# Patient Record
Sex: Female | Born: 1960 | Race: White | Hispanic: No | Marital: Married | Smoking: Never smoker
Health system: Southern US, Community
[De-identification: ages and names within clinical notes are randomized; demographics above are authoritative.]

## PROBLEM LIST (undated history)

## (undated) DIAGNOSIS — F419 Anxiety disorder, unspecified: Secondary | ICD-10-CM

## (undated) DIAGNOSIS — H04129 Dry eye syndrome of unspecified lacrimal gland: Secondary | ICD-10-CM

## (undated) DIAGNOSIS — N809 Endometriosis, unspecified: Secondary | ICD-10-CM

## (undated) DIAGNOSIS — I491 Atrial premature depolarization: Secondary | ICD-10-CM

## (undated) DIAGNOSIS — E785 Hyperlipidemia, unspecified: Secondary | ICD-10-CM

## (undated) DIAGNOSIS — K469 Unspecified abdominal hernia without obstruction or gangrene: Secondary | ICD-10-CM

## (undated) DIAGNOSIS — K219 Gastro-esophageal reflux disease without esophagitis: Secondary | ICD-10-CM

## (undated) HISTORY — PX: HEMORRHOID SURGERY: SHX153

## (undated) HISTORY — DX: Atrial premature depolarization: I49.1

## (undated) HISTORY — DX: Hyperlipidemia, unspecified: E78.5

## (undated) HISTORY — DX: Endometriosis, unspecified: N80.9

## (undated) HISTORY — DX: Unspecified abdominal hernia without obstruction or gangrene: K46.9

## (undated) HISTORY — DX: Dry eye syndrome of unspecified lacrimal gland: H04.129

## (undated) HISTORY — DX: Anxiety disorder, unspecified: F41.9

## (undated) HISTORY — PX: TUBAL LIGATION: SHX77

## (undated) HISTORY — PX: LAPAROSCOPY: SHX197

## (undated) HISTORY — DX: Gastro-esophageal reflux disease without esophagitis: K21.9

---

## 1999-03-15 ENCOUNTER — Inpatient Hospital Stay (HOSPITAL_COMMUNITY)
Admission: AD | Admit: 1999-03-15 | Discharge: 1999-03-15 | Payer: Self-pay | Admitting: Physical Medicine & Rehabilitation

## 1999-03-22 ENCOUNTER — Encounter (INDEPENDENT_AMBULATORY_CARE_PROVIDER_SITE_OTHER): Payer: Self-pay | Admitting: Specialist

## 1999-03-22 ENCOUNTER — Inpatient Hospital Stay (HOSPITAL_COMMUNITY): Admission: AD | Admit: 1999-03-22 | Discharge: 1999-03-24 | Payer: Self-pay | Admitting: Obstetrics and Gynecology

## 1999-04-25 ENCOUNTER — Other Ambulatory Visit: Admission: RE | Admit: 1999-04-25 | Discharge: 1999-04-25 | Payer: Self-pay | Admitting: Obstetrics and Gynecology

## 1999-12-05 ENCOUNTER — Encounter: Admission: RE | Admit: 1999-12-05 | Discharge: 1999-12-05 | Payer: Self-pay | Admitting: Obstetrics and Gynecology

## 1999-12-05 ENCOUNTER — Encounter: Payer: Self-pay | Admitting: Obstetrics and Gynecology

## 2000-05-31 ENCOUNTER — Other Ambulatory Visit: Admission: RE | Admit: 2000-05-31 | Discharge: 2000-05-31 | Payer: Self-pay | Admitting: Obstetrics and Gynecology

## 2001-09-17 ENCOUNTER — Other Ambulatory Visit: Admission: RE | Admit: 2001-09-17 | Discharge: 2001-09-17 | Payer: Self-pay | Admitting: Obstetrics and Gynecology

## 2002-09-22 ENCOUNTER — Other Ambulatory Visit: Admission: RE | Admit: 2002-09-22 | Discharge: 2002-09-22 | Payer: Self-pay | Admitting: Obstetrics and Gynecology

## 2002-11-18 ENCOUNTER — Encounter: Payer: Self-pay | Admitting: Obstetrics and Gynecology

## 2002-11-18 ENCOUNTER — Ambulatory Visit (HOSPITAL_COMMUNITY): Admission: RE | Admit: 2002-11-18 | Discharge: 2002-11-18 | Payer: Self-pay | Admitting: Obstetrics and Gynecology

## 2002-12-29 ENCOUNTER — Encounter: Admission: RE | Admit: 2002-12-29 | Discharge: 2002-12-29 | Payer: Self-pay | Admitting: Obstetrics and Gynecology

## 2002-12-29 ENCOUNTER — Encounter: Payer: Self-pay | Admitting: Obstetrics and Gynecology

## 2003-09-28 ENCOUNTER — Other Ambulatory Visit: Admission: RE | Admit: 2003-09-28 | Discharge: 2003-09-28 | Payer: Self-pay | Admitting: Obstetrics and Gynecology

## 2004-01-06 ENCOUNTER — Encounter: Admission: RE | Admit: 2004-01-06 | Discharge: 2004-01-06 | Payer: Self-pay | Admitting: Obstetrics and Gynecology

## 2004-09-26 ENCOUNTER — Other Ambulatory Visit: Admission: RE | Admit: 2004-09-26 | Discharge: 2004-09-26 | Payer: Self-pay | Admitting: Obstetrics and Gynecology

## 2005-01-17 ENCOUNTER — Encounter: Admission: RE | Admit: 2005-01-17 | Discharge: 2005-01-17 | Payer: Self-pay | Admitting: Obstetrics and Gynecology

## 2005-01-30 ENCOUNTER — Encounter: Admission: RE | Admit: 2005-01-30 | Discharge: 2005-01-30 | Payer: Self-pay | Admitting: Obstetrics and Gynecology

## 2005-05-02 ENCOUNTER — Ambulatory Visit: Payer: Self-pay | Admitting: Gastroenterology

## 2005-05-03 ENCOUNTER — Ambulatory Visit: Payer: Self-pay | Admitting: Gastroenterology

## 2005-05-09 ENCOUNTER — Ambulatory Visit: Payer: Self-pay | Admitting: Gastroenterology

## 2005-05-16 ENCOUNTER — Ambulatory Visit: Payer: Self-pay | Admitting: Gastroenterology

## 2005-09-28 ENCOUNTER — Other Ambulatory Visit: Admission: RE | Admit: 2005-09-28 | Discharge: 2005-09-28 | Payer: Self-pay | Admitting: Obstetrics and Gynecology

## 2005-10-13 ENCOUNTER — Encounter (INDEPENDENT_AMBULATORY_CARE_PROVIDER_SITE_OTHER): Payer: Self-pay | Admitting: Specialist

## 2005-10-13 ENCOUNTER — Encounter: Admission: RE | Admit: 2005-10-13 | Discharge: 2005-10-13 | Payer: Self-pay | Admitting: Obstetrics and Gynecology

## 2005-10-13 ENCOUNTER — Other Ambulatory Visit: Admission: RE | Admit: 2005-10-13 | Discharge: 2005-10-13 | Payer: Self-pay | Admitting: Diagnostic Radiology

## 2006-04-02 ENCOUNTER — Emergency Department (HOSPITAL_COMMUNITY): Admission: EM | Admit: 2006-04-02 | Discharge: 2006-04-02 | Payer: Self-pay | Admitting: Emergency Medicine

## 2006-10-08 ENCOUNTER — Encounter: Admission: RE | Admit: 2006-10-08 | Discharge: 2006-10-08 | Payer: Self-pay | Admitting: Family Medicine

## 2006-10-10 ENCOUNTER — Other Ambulatory Visit: Admission: RE | Admit: 2006-10-10 | Discharge: 2006-10-10 | Payer: Self-pay | Admitting: Obstetrics and Gynecology

## 2006-10-31 ENCOUNTER — Encounter: Admission: RE | Admit: 2006-10-31 | Discharge: 2006-10-31 | Payer: Self-pay | Admitting: Obstetrics and Gynecology

## 2007-06-11 ENCOUNTER — Ambulatory Visit: Payer: Self-pay | Admitting: Gastroenterology

## 2007-10-15 ENCOUNTER — Other Ambulatory Visit: Admission: RE | Admit: 2007-10-15 | Discharge: 2007-10-15 | Payer: Self-pay | Admitting: Obstetrics and Gynecology

## 2007-11-18 ENCOUNTER — Telehealth: Payer: Self-pay | Admitting: Gastroenterology

## 2007-11-18 ENCOUNTER — Encounter: Payer: Self-pay | Admitting: Gastroenterology

## 2008-10-15 ENCOUNTER — Other Ambulatory Visit: Admission: RE | Admit: 2008-10-15 | Discharge: 2008-10-15 | Payer: Self-pay | Admitting: Obstetrics and Gynecology

## 2008-10-30 ENCOUNTER — Encounter: Admission: RE | Admit: 2008-10-30 | Discharge: 2008-10-30 | Payer: Self-pay | Admitting: Obstetrics and Gynecology

## 2008-11-03 ENCOUNTER — Encounter: Admission: RE | Admit: 2008-11-03 | Discharge: 2008-11-03 | Payer: Self-pay | Admitting: Obstetrics and Gynecology

## 2009-01-12 ENCOUNTER — Telehealth: Payer: Self-pay | Admitting: Gastroenterology

## 2010-04-18 ENCOUNTER — Telehealth: Payer: Self-pay | Admitting: Gastroenterology

## 2010-05-31 ENCOUNTER — Encounter
Admission: RE | Admit: 2010-05-31 | Discharge: 2010-05-31 | Payer: Self-pay | Source: Home / Self Care | Attending: Obstetrics and Gynecology | Admitting: Obstetrics and Gynecology

## 2010-07-02 ENCOUNTER — Encounter: Payer: Self-pay | Admitting: Family Medicine

## 2010-07-03 ENCOUNTER — Encounter: Payer: Self-pay | Admitting: Obstetrics and Gynecology

## 2010-07-14 NOTE — Progress Notes (Signed)
Summary: Medication Refill  Phone Note Call from Patient Call back at (cell) [phone]   Caller: Patient Call For: Dr. Jarold Motto Reason for Call: Talk to Nurse Details for Reason: Medication Summary of Call: Spoke w/pt. and explained that she would need an OV before refilling her prescription. She does not want to come in, but is willing to do a "telephone consult."  Told her I would forward you the message. Thank you. Initial call taken by: Schuyler Amor,  April 18, 2010 2:34 PM  Follow-up for Phone Call        I advised the patient she has not been seen since 2008 and she was due for an office visit in 2010 and never scheduled one we have been telling her she needed an office visit since 10/30/2009, but she has not scheduled yet. I have given her a 30 day supply and advised her that she can get this medication from her Primary care if she sees them more often, but our office policy is not to rx patients with out seeing them and she since she does nto have barretts she MUST be seen every 2 years. She will not be given another refill if she does not call back to make an appt and keep it.  Follow-up by: Harlow Mares CMA Duncan Dull),  April 18, 2010 2:39 PM    New/Updated Medications: ACIPHEX 20 MG TBEC (RABEPRAZOLE SODIUM) one by mouth two times a day....MUST HAVE OFFICE VISIT. Prescriptions: ACIPHEX 20 MG TBEC (RABEPRAZOLE SODIUM) one by mouth two times a day....MUST HAVE OFFICE VISIT.  #60 x 0   Entered by:   Harlow Mares CMA (AAMA)   Authorized by:   Mardella Layman MD [facility]   Signed by:   Harlow Mares CMA (AAMA) on 04/18/2010   Method used:   Electronically to        [facility] [address] (retail)       [address], [address]       [place], [place]  [address]       Ph: [phone]       Fax: [phone]   RxID:   [id number]

## 2011-11-01 ENCOUNTER — Other Ambulatory Visit: Payer: Self-pay | Admitting: Obstetrics and Gynecology

## 2011-11-01 DIAGNOSIS — Z1231 Encounter for screening mammogram for malignant neoplasm of breast: Secondary | ICD-10-CM

## 2011-11-10 ENCOUNTER — Ambulatory Visit (INDEPENDENT_AMBULATORY_CARE_PROVIDER_SITE_OTHER): Payer: 59 | Admitting: General Surgery

## 2011-11-10 ENCOUNTER — Encounter (INDEPENDENT_AMBULATORY_CARE_PROVIDER_SITE_OTHER): Payer: Self-pay | Admitting: General Surgery

## 2011-11-10 VITALS — BP 94/62 | HR 74 | Temp 98.4°F | Resp 14 | Ht 67.5 in | Wt 200.2 lb

## 2011-11-10 DIAGNOSIS — K644 Residual hemorrhoidal skin tags: Secondary | ICD-10-CM

## 2011-11-10 NOTE — Progress Notes (Signed)
Patient ID: Kim Young, female   DOB: 1961-02-05, 51 y.o.   MRN: [id number]  Chief Complaint  Patient presents with  . New Evaluation    Est. Pt. Eval of External Hems    HPI Kim Young is a 51 y.o. female.  Referred by Dr. Shaune Pollack HPI This is a 51 year old female who has about a 1-1/2 year history of a mass near her anus. This is recently become more painful area she is now having difficulty with hygiene in this region as well. She has some occasional blood that she notices on the toilet paper. She is a history of 2 pregnancies. She has no family history of colorectal cancer. She has no prior colonoscopy. She is having a increasing constipation over the last couple weeks after her medication change but before that every other day bowel movements.Past Medical History  Diagnosis Date  . Palpitations   . Hyperlipidemia     Past Surgical History  Procedure Date  . Cesarean section 1998&2000    Family History  Problem Relation Age of Onset  . Heart disease Mother   . Stroke Mother   . Cancer Father     pancreatic  . Cancer Sister     Cervical  . Cancer Maternal Grandmother     Lung    Social History History  Substance Use Topics  . Smoking status: Never Smoker   . Smokeless tobacco: Never Used  . Alcohol Use: No    Allergies  Allergen Reactions  . Penicillins     REACTION: unspecified    Current Outpatient Prescriptions  Medication Sig Dispense Refill  . BYSTOLIC 5 MG tablet       . LOESTRIN 24 FE 1-20 MG-MCG tablet       . sertraline (ZOLOFT) 100 MG tablet       . simvastatin (ZOCOR) 40 MG tablet         Review of Systems Review of Systems  Constitutional: Negative for fever, chills and unexpected weight change.  HENT: Negative for hearing loss, congestion, sore throat, trouble swallowing and voice change.   Eyes: Negative for visual disturbance.  Respiratory: Negative for cough and wheezing.   Cardiovascular: Negative for chest pain,  palpitations and leg swelling.  Gastrointestinal: Positive for constipation and rectal pain. Negative for nausea, vomiting, abdominal pain, diarrhea, blood in stool, abdominal distention and anal bleeding.  Genitourinary: Negative for hematuria, vaginal bleeding and difficulty urinating.  Musculoskeletal: Negative for arthralgias.  Skin: Negative for rash and wound.  Neurological: Negative for seizures, syncope and headaches.  Hematological: Negative for adenopathy. Does not bruise/bleed easily.  Psychiatric/Behavioral: Negative for confusion.    Blood pressure 94/62, pulse 74, temperature 98.4 F (36.9 C), temperature source Temporal, resp. rate 14, height 5' 7.5" (1.715 m), weight 200 lb 3.2 oz (90.81 kg).  Physical Exam Physical Exam  Vitals reviewed. Constitutional: She appears well-developed and well-nourished.  Genitourinary: Rectal exam shows external hemorrhoid. Rectal exam shows no fissure. Guaiac negative stool.        Assessment    Symptomatic external hemorrhoid    Plan    The hemorrhoid is certainly not going to go away on its own. We discussed excising this external hemorrhoid in the office. We discussed risks of bleeding infection. We also a long discussion about numerous conservative measures to prevent hemorrhoids moving forward in the future.       Kim Young 11/10/2011, 9:55 AM

## 2011-11-10 NOTE — Patient Instructions (Signed)
1. Begin drinking 5-7 glasses of water per day 2. Start using fiber supplement daily 3. Minimize caffeine 4. Start using Miralax daily can get at drugstore     Hemorrhoids Hemorrhoids are enlarged (dilated) veins around the rectum. There are 2 types of hemorrhoids, and the type of hemorrhoid is determined by its location. Internal hemorrhoids occur in the veins just inside the rectum.They are usually not painful, but they may bleed.However, they may poke through to the outside and become irritated and painful. External hemorrhoids involve the veins outside the anus and can be felt as a painful swelling or hard lump near the anus.They are often itchy and may crack and bleed. Sometimes clots will form in the veins. This makes them swollen and painful. These are called thrombosed hemorrhoids. CAUSES Causes of hemorrhoids include:  Pregnancy. This increases the pressure in the hemorrhoidal veins.   Constipation.   Straining to have a bowel movement.   Obesity.   Heavy lifting or other activity that caused you to strain.  TREATMENT Most of the time hemorrhoids improve in 1 to 2 weeks. However, if symptoms do not seem to be getting better or if you have a lot of rectal bleeding, your caregiver may perform a procedure to help make the hemorrhoids get smaller or remove them completely.Possible treatments include:  Rubber band ligation. A rubber band is placed at the base of the hemorrhoid to cut off the circulation.   Sclerotherapy. A chemical is injected to shrink the hemorrhoid.   Infrared light therapy. Tools are used to burn the hemorrhoid.   Hemorrhoidectomy. This is surgical removal of the hemorrhoid.  HOME CARE INSTRUCTIONS   Increase fiber in your diet. Ask your caregiver about using fiber supplements.   Drink enough water and fluids to keep your urine clear or pale yellow.   Exercise regularly.   Go to the bathroom when you have the urge to have a bowel movement. Do not  wait.   Avoid straining to have bowel movements.   Keep the anal area dry and clean.   Only take over-the-counter or prescription medicines for pain, discomfort, or fever as directed by your caregiver.  If your hemorrhoids are thrombosed:  Take warm sitz baths for 20 to 30 minutes, 3 to 4 times per day.   If the hemorrhoids are very tender and swollen, place ice packs on the area as tolerated. Using ice packs between sitz baths may be helpful. Fill a plastic bag with ice. Place a towel between the bag of ice and your skin.   Medicated creams and suppositories may be used or applied as directed.   Do not use a donut-shaped pillow or sit on the toilet for long periods. This increases blood pooling and pain.  SEEK MEDICAL CARE IF:   You have increasing pain and swelling that is not controlled with your medicine.   You have uncontrolled bleeding.   You have difficulty or you are unable to have a bowel movement.   You have pain or inflammation outside the area of the hemorrhoids.   You have chills or an oral temperature above 102 F (38.9 C).  MAKE SURE YOU:   Understand these instructions.   Will watch your condition.   Will get help right away if you are not doing well or get worse.  Document Released: 05/26/2000 Document Revised: 05/18/2011 Document Reviewed: 10/01/2007 [facility] Patient Information 2012 [place], [place].

## 2011-11-21 ENCOUNTER — Ambulatory Visit
Admission: RE | Admit: 2011-11-21 | Discharge: 2011-11-21 | Disposition: A | Discharge status: Home / Self Care | Payer: 59 | Source: Ambulatory Visit | Attending: Obstetrics and Gynecology | Admitting: Obstetrics and Gynecology

## 2011-11-21 DIAGNOSIS — Z1231 Encounter for screening mammogram for malignant neoplasm of breast: Secondary | ICD-10-CM

## 2011-11-23 ENCOUNTER — Encounter (INDEPENDENT_AMBULATORY_CARE_PROVIDER_SITE_OTHER): Payer: Self-pay | Admitting: General Surgery

## 2011-11-23 ENCOUNTER — Ambulatory Visit (INDEPENDENT_AMBULATORY_CARE_PROVIDER_SITE_OTHER): Payer: 59 | Admitting: General Surgery

## 2011-11-23 VITALS — BP 120/60 | HR 60 | Resp 16 | Ht 67.0 in | Wt 199.0 lb

## 2011-11-23 DIAGNOSIS — K644 Residual hemorrhoidal skin tags: Secondary | ICD-10-CM

## 2011-11-23 MED ORDER — OXYCODONE-ACETAMINOPHEN 10-325 MG PO TABS: 1.0000 | ORAL_TABLET | Freq: Four times a day (QID) | ORAL | Status: AC | PRN

## 2011-11-23 NOTE — Patient Instructions (Signed)
[facility] _______[facility] ° °RECTAL SURGERY POST OP INSTRUCTIONS: POST OP INSTRUCTIONS ° °Always review your discharge instruction sheet given to you by the facility where your surgery was performed. °IF YOU HAVE DISABILITY OR FAMILY LEAVE FORMS, YOU MUST BRING THEM TO THE OFFICE FOR PROCESSING.   °DO NOT GIVE THEM TO YOUR DOCTOR. ° °1. A  prescription for pain medication may be given to you upon discharge.  Take your pain medication as prescribed, if needed.  If narcotic pain medicine is not needed, then you may take acetaminophen (Tylenol) or ibuprofen (Advil) as needed. °2. Take your usually prescribed medications unless otherwise directed. °3. If you need a refill on your pain medication, please contact your pharmacy.  They will contact our office to request authorization. Prescriptions will not be filled after 5 pm or on week-ends. °4. You should follow a light diet the first 48 hours after arrival home, such as soup and crackers, etc.  Be sure to include lots of fluids daily.  Resume your normal diet 2-3 days after surgery.. °5. Most patients will experience some swelling and discomfort in the rectal area. Ice packs, reclining and warm tub soaks will help.  Swelling and discomfort can take several days to resolve.  °6. It is common to experience some constipation if taking pain medication after surgery.  Increasing fluid intake and taking a stool softener (such as Colace) will usually help or prevent this problem from occurring.  A mild laxative (Milk of Magnesia or Miralax) should be taken according to package directions if there are no bowel movements after 48 hours. °7. Unless discharge instructions indicate otherwise, leave your bandage dry and in place for 24 hours, or remove the bandage if you have a bowel movement. You may notice a small amount of bleeding with bowel movements for the first few days. You may have some packing in the rectum which will come out over the first day or two. You  will need to wear an absorbent pad or soft cotton gauze in your underwear until the drainage stops.it. °8. ACTIVITIES:  You may resume regular (light) daily activities beginning the next day--such as daily self-care, walking, climbing stairs--gradually increasing activities as tolerated.  You may have sexual intercourse when it is comfortable.  Refrain from any heavy lifting or straining until approved by your doctor. °a. You may drive when you are no longer taking prescription pain medication, you can comfortably wear a seatbelt, and you can safely maneuver your car and apply brakes. °b. RETURN TO WORK: : ____________________ °c.  °9. You should see your doctor in the office for a follow-up appointment approximately 2-3 weeks after your surgery.  Make sure that you call for this appointment within a day or two after you arrive home to insure a convenient appointment time. °10. OTHER INSTRUCTIONS:  __________________________________________________________________________________________________________________________________________________________________________________________  °WHEN TO CALL YOUR DOCTOR: °1. Fever over 101.0 °2. Inability to urinate °3. Nausea and/or vomiting °4. Extreme swelling or bruising °5. Continued bleeding from rectum. °6. Increased pain, redness, or drainage from the incision °7. Constipation ° °The clinic staff is available to answer your questions during regular business hours.  Please don’t hesitate to call and ask to speak to one of the nurses for clinical concerns.  If you have a medical emergency, go to the nearest emergency room or call 911.  A surgeon from [facility] is always on call at the hospital ° ° °[address], [address], [place], [place]  [address] ? °   [address] [address], [place], [place]   [address] °[phone] ? [phone] ? FAX [phone] °Web site: [email or web address] ° °

## 2011-11-23 NOTE — Progress Notes (Signed)
Subjective:     Patient ID: Kim Young, female   DOB: [birth year], 51 y.o.   MRN: [id number]  HPI  52 yof with symptomatic rectal skin tag who presents for removal.  There have been no changes since our last visit.  Review of Systems     Objective:   Physical Exam Anterior anal skin tag, anoscopy with very small internal hemorrhoids    Assessment:     Anal skin tag    Plan:     We discussed excision.  I then performed anoscopy first.  I then cleansed the area with betadine.  I then performed an anal block with 1% lidocaine/.25% marcaine.  I then excised the skin tag sharply.  I closed the defect with 3-0 chromic suture.  Dressing was applied.

## 2011-11-24 ENCOUNTER — Encounter (INDEPENDENT_AMBULATORY_CARE_PROVIDER_SITE_OTHER): Payer: 59 | Admitting: General Surgery

## 2011-11-29 ENCOUNTER — Telehealth (INDEPENDENT_AMBULATORY_CARE_PROVIDER_SITE_OTHER): Payer: Self-pay

## 2011-11-29 NOTE — Telephone Encounter (Signed)
Called pt to notify her that her path was a benign polyp. I made a f/u visit for pt on 7/3 with DrWakefield.

## 2011-12-13 ENCOUNTER — Encounter (INDEPENDENT_AMBULATORY_CARE_PROVIDER_SITE_OTHER): Payer: Self-pay | Admitting: General Surgery

## 2011-12-13 ENCOUNTER — Ambulatory Visit (INDEPENDENT_AMBULATORY_CARE_PROVIDER_SITE_OTHER): Payer: 59 | Admitting: General Surgery

## 2011-12-13 VITALS — BP 112/66 | HR 64 | Temp 98.0°F | Resp 12 | Ht 67.0 in | Wt 201.6 lb

## 2011-12-13 DIAGNOSIS — Z09 Encounter for follow-up examination after completed treatment for conditions other than malignant neoplasm: Secondary | ICD-10-CM

## 2011-12-13 NOTE — Progress Notes (Signed)
Subjective:     Patient ID: Kim Young, female   DOB: [birth year], 51 y.o.   MRN: [id number]  HPI 6 yof s/p excision of anal skin tag.  Doing well now without complaints.   Review of Systems     Objective:   Physical Exam Mild edema in perianal region but otherwise healing well no infection    Assessment:     S/p skin tag excision    Plan:     Will continue fiber and water, I will see back as needed

## 2012-10-23 ENCOUNTER — Encounter: Payer: Self-pay | Admitting: Obstetrics and Gynecology

## 2012-12-31 ENCOUNTER — Other Ambulatory Visit: Payer: Self-pay

## 2012-12-31 DIAGNOSIS — Z1231 Encounter for screening mammogram for malignant neoplasm of breast: Secondary | ICD-10-CM

## 2013-01-13 ENCOUNTER — Ambulatory Visit
Admission: RE | Admit: 2013-01-13 | Discharge: 2013-01-13 | Disposition: A | Discharge status: Home / Self Care | Payer: 59 | Source: Ambulatory Visit

## 2013-01-13 DIAGNOSIS — Z1231 Encounter for screening mammogram for malignant neoplasm of breast: Secondary | ICD-10-CM

## 2013-02-12 ENCOUNTER — Ambulatory Visit: Payer: Self-pay | Admitting: Obstetrics and Gynecology

## 2013-02-14 ENCOUNTER — Ambulatory Visit: Payer: Self-pay | Admitting: Obstetrics and Gynecology

## 2013-02-18 ENCOUNTER — Encounter: Payer: Self-pay | Admitting: Obstetrics and Gynecology

## 2013-02-19 ENCOUNTER — Encounter: Payer: Self-pay | Admitting: Obstetrics and Gynecology

## 2013-02-19 ENCOUNTER — Ambulatory Visit (INDEPENDENT_AMBULATORY_CARE_PROVIDER_SITE_OTHER): Payer: 59 | Admitting: Obstetrics and Gynecology

## 2013-02-19 VITALS — BP 100/66 | HR 56 | Ht 67.0 in | Wt 202.5 lb

## 2013-02-19 DIAGNOSIS — Z01419 Encounter for gynecological examination (general) (routine) without abnormal findings: Secondary | ICD-10-CM

## 2013-02-19 DIAGNOSIS — Z0000 Encounter for general adult medical examination without abnormal findings: Secondary | ICD-10-CM

## 2013-02-19 DIAGNOSIS — N390 Urinary tract infection, site not specified: Secondary | ICD-10-CM

## 2013-02-19 LAB — POCT URINALYSIS DIPSTICK
Nitrite, UA: NEGATIVE
pH, UA: 5

## 2013-02-19 MED ORDER — NORETHIN ACE-ETH ESTRAD-FE 1-20 MG-MCG(24) PO TABS: 1.0000 | ORAL_TABLET | Freq: Every day | ORAL | Status: DC

## 2013-02-19 NOTE — Progress Notes (Signed)
Patient ID: Kim Young, female   DOB: Feb 13, 1961, 52 y.o.   MRN: [id number] GYNECOLOGY VISIT  PCP: Dr. Shaune Pollack  Referring provider:   HPI: 52 y.o.   Married  Caucasian  female   G2P2002 with Patient's last menstrual period was 01/13/2013.   here for   AEX. No problems. Happy with LoEstrin 24.  Wants to continue.   Feels hot all the time.    Hgb:  PCP Urine:  1+ WBC's, asymptomatic.  Last UTI was a long time ago.   GYNECOLOGIC HISTORY: Patient's last menstrual period was 01/13/2013. Sexually active:  yes Partner preference: female Contraception:  Loestrin 24 OCP's Menopausal hormone therapy: no DES exposure:  no  Blood transfusions:   no Sexually transmitted diseases:  no  GYN Procedures:  2 C-sections and tubal ligation Mammogram:  01/2013 wnl: [facility].  Has a history of left breast cyst aspiration - within the last 10 years - benign.              Pap:  02-03-11 wnl  History of abnormal pap smear:  no   OB History   Grav Para Term Preterm Abortions TAB SAB Ect Mult Living   2 2 2       2        LIFESTYLE: Exercise:     no         Tobacco:     no Alcohol:        no Drug use:     no  OTHER HEALTH MAINTENANCE: Tetanus/TDap:  PCP Gardisil:  NA Influenza:  02/2012 Zostavax:  NA  Bone density: n/a Colonoscopy: n/a  Cholesterol check:  2013 wnl with medication:PCP  Family History  Problem Relation Age of Onset  . Heart disease Mother   . Stroke Mother   . Diabetes Mother   . CVA Mother 67    on dialysis  . Hypertension Mother   . Hyperlipidemia Mother   . Cancer Father     pancreatic  . Cancer Sister     Cervical  . Cancer Maternal Grandmother     Lung    Patient Active Problem List   Diagnosis Date Noted  . External hemorrhoids with complication 11/10/2011   Past Medical History  Diagnosis Date  . Palpitations   . Hyperlipidemia   . PAC (premature atrial contraction)   . Hyperlipidemia   . GERD (gastroesophageal reflux disease)    . Hernia     D. Jarold Motto  . Dry eye   . Endometriosis     Past Surgical History  Procedure Laterality Date  . Hemorrhoid surgery    . Tubal ligation    . Cesarean section  1998&2000    x2  . Laparoscopy      endometriosis    ALLERGIES: Penicillins  Current Outpatient Prescriptions  Medication Sig Dispense Refill  . BYSTOLIC 5 MG tablet       . LOESTRIN 24 FE 1-20 MG-MCG tablet       . sertraline (ZOLOFT) 100 MG tablet Take 100 mg by mouth daily.      . simvastatin (ZOCOR) 40 MG tablet        No current facility-administered medications for this visit.     ROS:  Pertinent items are noted in HPI.  SOCIAL HISTORY:  2 children.    PHYSICAL EXAMINATION:    BP 100/66  Pulse 56  Ht 5\' 7"  (1.702 m)  Wt 202 lb 8 oz (91.853 kg)  BMI 31.71  kg/m2  LMP 01/13/2013   Wt Readings from Last 3 Encounters:  02/19/13 202 lb 8 oz (91.853 kg)  12/13/11 201 lb 9.6 oz (91.445 kg)  11/23/11 199 lb (90.266 kg)     Ht Readings from Last 3 Encounters:  02/19/13 5\' 7"  (1.702 m)  12/13/11 5\' 7"  (1.702 m)  11/23/11 5\' 7"  (1.702 m)    General appearance: alert, cooperative and appears stated age Head: Normocephalic, without obvious abnormality, atraumatic Neck: no adenopathy, supple, symmetrical, trachea midline and thyroid not enlarged, symmetric, no tenderness/mass/nodules Lungs: clear to auscultation bilaterally Breasts: Inspection negative, No nipple retraction or dimpling, No nipple discharge or bleeding, No axillary or supraclavicular adenopathy, Normal to palpation without dominant masses Heart: regular rate and rhythm Abdomen: soft, non-tender; no masses,  no organomegaly Extremities: extremities normal, atraumatic, no cyanosis or edema Skin: Skin color, texture, turgor normal. No rashes or lesions Lymph nodes: Cervical, supraclavicular, and axillary nodes normal. No abnormal inguinal nodes palpated Neurologic: Grossly normal  Pelvic: External genitalia:  no lesions               Urethra:  normal appearing urethra with no masses, tenderness or lesions              Bartholins and Skenes: normal                 Vagina: normal appearing vagina with normal color and discharge, no lesions              Cervix: normal appearance              Pap and high risk HPV testing done: yes.            Bimanual Exam:  Uterus:  uterus is normal size, shape, consistency and nontender                                      Adnexa: normal adnexa in size, nontender and no masses                                      Rectovaginal: Confirms                                      Anus:  normal sphincter tone, no lesions  ASSESSMENT  Normal gynecologic exam. Pyuria.  PLAN  Urine culture.  Mammogram yearly. Pap smear and high risk HPV testing Counseled on breast self exam, adequate intake of calcium and vitamin D Loestrin 24 x 12 months per Epic orders Return annually or prn   An After Visit Summary was printed and given to the patient.

## 2013-02-19 NOTE — Patient Instructions (Signed)

## 2013-02-21 LAB — IPS PAP TEST WITH HPV

## 2013-02-26 ENCOUNTER — Other Ambulatory Visit: Payer: Self-pay | Admitting: Obstetrics and Gynecology

## 2013-02-27 NOTE — Telephone Encounter (Signed)
eScribe request for refill on MICROGESTIN FE 1/20 Last AEX - 02/19/13 Next AEX - 02/20/13 Pt was changed to LOESTRIN FE 24 at AEX on 02/19/13 and RX x 1 year sent.  RX denied

## 2013-03-14 ENCOUNTER — Ambulatory Visit: Payer: Self-pay | Admitting: Obstetrics and Gynecology

## 2013-04-22 ENCOUNTER — Telehealth: Payer: Self-pay | Admitting: Cardiology

## 2013-04-22 NOTE — Telephone Encounter (Signed)
New Problem:  Pt is requesting she have blood work done before her next appt. Pt does not have lab orders in Epic. Please let pt know if she needs blood work or not.

## 2013-04-23 ENCOUNTER — Other Ambulatory Visit: Payer: Self-pay | Admitting: General Surgery

## 2013-04-23 DIAGNOSIS — I2510 Atherosclerotic heart disease of native coronary artery without angina pectoris: Secondary | ICD-10-CM

## 2013-04-23 DIAGNOSIS — Z79899 Other long term (current) drug therapy: Secondary | ICD-10-CM

## 2013-04-23 NOTE — Telephone Encounter (Signed)
Pt aware lab work put in on Weds 04/30/13 for NMR and ALT Panel. Labs ordered and put on lab schedule.

## 2013-04-23 NOTE — Telephone Encounter (Signed)
Ok to order fasting NMR lipid panel and ALT

## 2013-04-30 ENCOUNTER — Other Ambulatory Visit (INDEPENDENT_AMBULATORY_CARE_PROVIDER_SITE_OTHER): Payer: 59

## 2013-04-30 DIAGNOSIS — I2510 Atherosclerotic heart disease of native coronary artery without angina pectoris: Secondary | ICD-10-CM

## 2013-04-30 DIAGNOSIS — Z79899 Other long term (current) drug therapy: Secondary | ICD-10-CM

## 2013-05-01 LAB — NMR LIPOPROFILE WITH LIPIDS
HDL Size: 8.6 nm — ABNORMAL LOW (ref >=9.2)
LDL Size: 20.3 nm — ABNORMAL LOW (ref >20.5)
VLDL Size: 51.3 nm — ABNORMAL HIGH (ref <=46.6)

## 2013-05-02 ENCOUNTER — Ambulatory Visit (INDEPENDENT_AMBULATORY_CARE_PROVIDER_SITE_OTHER): Payer: 59 | Admitting: Cardiology

## 2013-05-02 ENCOUNTER — Encounter: Payer: Self-pay | Admitting: Cardiology

## 2013-05-02 VITALS — BP 116/70 | Ht 67.0 in | Wt 203.0 lb

## 2013-05-02 DIAGNOSIS — E785 Hyperlipidemia, unspecified: Secondary | ICD-10-CM

## 2013-05-02 DIAGNOSIS — I491 Atrial premature depolarization: Secondary | ICD-10-CM

## 2013-05-02 NOTE — Progress Notes (Signed)
  [address] [place], [place]  [address] Phone: [phone] Fax:  [phone]  Date:  05/02/2013   ID:  Kim Young, DOB 04/28/1961, MRN [id number]  PCP:  Hollice Espy, MD  Cardiologist:  Armanda Magic, MD     History of Present Illness: Kim Young is a 52 y.o. female with a history of PAC's and dyslipidemia.  She is doing well.  She denies any chest pain, SOB, DOE, LE edema, dizziness, palpitations or syncope.     Wt Readings from Last 3 Encounters:  05/02/13 203 lb (92.08 kg)  02/19/13 202 lb 8 oz (91.853 kg)  12/13/11 201 lb 9.6 oz (91.445 kg)     Past Medical History  Diagnosis Date  . Palpitations   . GERD (gastroesophageal reflux disease)   . Hernia     D. Jarold Motto  . Dry eye   . Endometriosis   . PAC (premature atrial contraction)   . Hyperlipidemia   . Hyperlipidemia   . Anxiety     Current Outpatient Prescriptions  Medication Sig Dispense Refill  . BYSTOLIC 5 MG tablet       . Norethindrone Acetate-Ethinyl Estrad-FE (LOESTRIN 24 FE) 1-20 MG-MCG(24) tablet Take 1 tablet by mouth daily.  3 Package  3  . sertraline (ZOLOFT) 100 MG tablet Take 100 mg by mouth daily.      . simvastatin (ZOCOR) 40 MG tablet        No current facility-administered medications for this visit.    Allergies:    Allergies  Allergen Reactions  . Cymbalta [Duloxetine Hcl]     agitation  . Penicillins     REACTION: unspecified    Social History:  The patient  reports that she has never smoked. She has never used smokeless tobacco. She reports that she does not drink alcohol or use illicit drugs.   Family History:  The patient's family history includes CVA (age of onset: 48) in her mother; Cancer in her father, maternal grandmother, and sister; Diabetes in her mother; Heart disease in her mother; Hyperlipidemia in her mother; Hypertension in her mother; Stroke in her mother.   ROS:  Please see the history of present illness.      All other systems  reviewed and negative.   PHYSICAL EXAM: VS:  BP 116/70  Ht 5\' 7"  (1.702 m)  Wt 203 lb (92.08 kg)  BMI 31.79 kg/m2 Well nourished, well developed, in no acute distress HEENT: normal Neck: no JVD Cardiac:  normal S1, S2; RRR; no murmur Lungs:  clear to auscultation bilaterally, no wheezing, rhonchi or rales Abd: soft, nontender, no hepatomegaly Ext: no edema Skin: warm and dry Neuro:  CNs 2-12 intact, no focal abnormalities noted  EKG:  NSR with no ST changes     ASSESSMENT AND PLAN:  1. PAC's - asymptomatic  - continue Bystolic 2. Dyslipidemia - her last NMR panel was high risk with poorly controlled LDL.  I have referred her back to lipid clinic  - continue simvastatin  Followup with me in 1 year  Signed, Armanda Magic, MD 05/02/2013 4:08 PM

## 2013-05-02 NOTE — Patient Instructions (Signed)
Your physician recommends that you continue on your current medications as directed. Please refer to the Current Medication list given to you today.  Your physician wants you to follow-up in: 1 year with Dr. Turner. You will receive a reminder letter in the mail two months in advance. If you don't receive a letter, please call our office to schedule the follow-up appointment.  

## 2013-05-15 ENCOUNTER — Telehealth: Payer: Self-pay | Admitting: General Surgery

## 2013-05-15 ENCOUNTER — Encounter: Payer: Self-pay | Admitting: General Surgery

## 2013-05-15 DIAGNOSIS — I2510 Atherosclerotic heart disease of native coronary artery without angina pectoris: Secondary | ICD-10-CM

## 2013-05-15 DIAGNOSIS — Z79899 Other long term (current) drug therapy: Secondary | ICD-10-CM

## 2013-05-15 MED ORDER — NEBIVOLOL HCL 5 MG PO TABS: 5.0000 mg | ORAL_TABLET | Freq: Every day | ORAL | Status: DC

## 2013-05-15 MED ORDER — PSYLLIUM 28.3 % PO POWD: 1.0000 | Freq: Two times a day (BID) | ORAL | Status: DC

## 2013-05-15 NOTE — Telephone Encounter (Signed)
Message copied by Nita Sells on Thu May 15, 2013  9:53 AM ------      Message from: Lou Miner      Created: Tue May 13, 2013  4:03 PM       Given cholesterol improving on simvastatin 40 mg qd, and LDL-P trending towards goal of < 1300, have patient continue simvastatin 40 mg qd, and add metamucil twice daily.  This will lower cholesterol in a similar way as Zetia, but without side effects.              Plan:      1.  Continue simvastatin 40 mg qd.      2.  Take Metamucil powder - 1 tablespoon with 8 ounces of water twice daily in between meals.      3.  Recheck NMR LipoProfile and Hepatic panel in 4 months, then have patient follow up with me 3-4 days later.  You can forward the date and time she is to see me and I can create appointment (under Audrie Lia).      Please notify patient, update meds, and set up lab.  Send appointment date/time back to me if you need me to set it up. Thanks. ------

## 2013-05-15 NOTE — Telephone Encounter (Signed)
Pt is aware. Metamucil added to pts med list. Pt is set up for lab work 4.3.15. She will be coming in to see Riki Rusk on 09/17/13 for lipid clinic. Sent to [facility] to schedule pt.

## 2013-05-15 NOTE — Telephone Encounter (Signed)
Spoke to pt. Gave results for Lab work and refilled rx for pt.

## 2013-05-15 NOTE — Telephone Encounter (Signed)
Follow up         Pt said she has been waiting 2 wks for a refill on her prescriptions plus BYFTOLIC.   Pt was instructed that Dr will consult with Riki Rusk and get back to her it has been a wk now.    Pt needs a call back about this please.

## 2013-06-02 ENCOUNTER — Telehealth: Payer: Self-pay | Admitting: Cardiology

## 2013-06-02 MED ORDER — SIMVASTATIN 40 MG PO TABS: 40.0000 mg | ORAL_TABLET | Freq: Every day | ORAL | Status: DC

## 2013-06-02 NOTE — Telephone Encounter (Signed)
Made pt aware med has been sent to pharmacy for her.

## 2013-06-02 NOTE — Telephone Encounter (Signed)
New proble,    Pt needs SIMBASTATON called in to [facility] [address]    ASAP please.

## 2013-08-09 ENCOUNTER — Other Ambulatory Visit: Payer: Self-pay | Admitting: Cardiology

## 2013-09-12 ENCOUNTER — Other Ambulatory Visit: Payer: 59

## 2013-09-17 ENCOUNTER — Ambulatory Visit: Payer: 59 | Admitting: Pharmacist

## 2013-09-19 ENCOUNTER — Ambulatory Visit: Payer: 59 | Admitting: Pharmacist

## 2013-12-12 ENCOUNTER — Other Ambulatory Visit: Payer: Self-pay | Admitting: Cardiology

## 2014-02-07 ENCOUNTER — Other Ambulatory Visit: Payer: Self-pay | Admitting: Obstetrics and Gynecology

## 2014-02-09 NOTE — Telephone Encounter (Signed)
Last refilled/AEX: 02/19/13 #3 packs with 3 refills Dr. Jose Persia Scheduled: 02/20/14 with Dr. Edward Jolly Last mammogram: 01/13/13 Bi-Rads 1 No current mammogram scheduled.  Please Advise.

## 2014-02-20 ENCOUNTER — Ambulatory Visit (INDEPENDENT_AMBULATORY_CARE_PROVIDER_SITE_OTHER): Payer: 59 | Admitting: Obstetrics and Gynecology

## 2014-02-20 ENCOUNTER — Encounter: Payer: Self-pay | Admitting: Obstetrics and Gynecology

## 2014-02-20 VITALS — BP 110/80 | HR 60 | Resp 18 | Ht 67.25 in | Wt 202.0 lb

## 2014-02-20 DIAGNOSIS — Z1211 Encounter for screening for malignant neoplasm of colon: Secondary | ICD-10-CM

## 2014-02-20 DIAGNOSIS — Z01419 Encounter for gynecological examination (general) (routine) without abnormal findings: Secondary | ICD-10-CM

## 2014-02-20 DIAGNOSIS — Z0000 Encounter for general adult medical examination without abnormal findings: Secondary | ICD-10-CM

## 2014-02-20 DIAGNOSIS — N912 Amenorrhea, unspecified: Secondary | ICD-10-CM

## 2014-02-20 LAB — POCT URINALYSIS DIPSTICK
Bilirubin, UA: NEGATIVE
GLUCOSE UA: NEGATIVE
KETONES UA: NEGATIVE
Leukocytes, UA: NEGATIVE
Nitrite, UA: NEGATIVE
Protein, UA: NEGATIVE
RBC UA: NEGATIVE
Urobilinogen, UA: NEGATIVE
pH, UA: 5

## 2014-02-20 NOTE — Patient Instructions (Signed)

## 2014-02-20 NOTE — Progress Notes (Addendum)
Patient ID: Kim Young, female   DOB: 1961-03-11, 53 y.o.   MRN: [id number] GYNECOLOGY VISIT  PCP:  Shaune Pollack, MD  Referring provider:   HPI: 53 y.o.   Married  Caucasian  female   G2P2002 with No LMP recorded.   here for   AEX. On OCPs.  Not really having menses, just light spotting.  This is about the same as last year.  No cramping. No hot flashes.   Takes Bystolic for PACs. Sees cardiology before the end of the year.  Sees Dr. Mayford Knife.  Has elevated CRP.  Sister died of cervical cancer.   Hgb:    Labs with cardiologist Urine:  Neg  GYNECOLOGIC HISTORY: No LMP recorded. Sexually active:  yes Partner preference:  female Contraception:  OCP's--Loestrin-24  Menopausal hormone therapy: n/a DES exposure:  no  Blood transfusions: no   Sexually transmitted diseases:  no  GYN procedures and prior surgeries:  C-section x2 and tubal ligation. Last mammogram:  01-13-13 heterogeneously dense breasts, otherwise normal:[facility].  Has history of left breast cyst aspiration--within the last 10 years--benign.            Last pap and high risk HPV testing:  02-19-13 wnl:neg HR HPV  History of abnormal pap smear:  no   OB History   Grav Para Term Preterm Abortions TAB SAB Ect Mult Living   LIFESTYLE: Exercise:  no              OTHER HEALTH MAINTENANCE: Tetanus/TDap:  Up to date with PCP HPV:                  n/a Influenza:          03/2013   Bone density:    n/a Colonoscopy:   n/a  Cholesterol check:   Elevated--takes medication--followed by cardiologist.  Family History  Problem Relation Age of Onset  . Heart disease Mother   . Stroke Mother   . Diabetes Mother   . CVA Mother 5    on dialysis  . Hypertension Mother   . Hyperlipidemia Mother   . Cancer Father     pancreatic  . Cancer Sister     Cervical  . Cancer Maternal Grandmother     Lung    Patient Active Problem List   Diagnosis Date Noted  . PAC (premature atrial  contraction)   . Hyperlipidemia   . External hemorrhoids with complication 11/10/2011   Past Medical History  Diagnosis Date  . Palpitations   . GERD (gastroesophageal reflux disease)   . Hernia     D. Jarold Motto  . Dry eye   . Endometriosis   . PAC (premature atrial contraction)   . Hyperlipidemia   . Hyperlipidemia   . Anxiety     Past Surgical History  Procedure Laterality Date  . Hemorrhoid surgery    . Tubal ligation    . Cesarean section  1998&2000    x2  . Laparoscopy      endometriosis    ALLERGIES: Cymbalta and Penicillins  Current Outpatient Prescriptions  Medication Sig Dispense Refill  . LOMEDIA 24 FE 1-20 MG-MCG(24) tablet TAKE ONE TABLET BY MOUTH ONCE DAILY  30 tablet  0  . nebivolol (BYSTOLIC) 5 MG tablet Take 1 tablet (5 mg total) by mouth daily.  90 tablet  3  . sertraline (ZOLOFT) 100 MG tablet Take 100 mg  by mouth daily.      . simvastatin (ZOCOR) 40 MG tablet TAKE ONE TABLET BY MOUTH ONCE DAILY  30 tablet  3   No current facility-administered medications for this visit.     ROS:  Pertinent items are noted in HPI.  History   Social History  . Marital Status: Married    Spouse Name: N/A    Number of Children: N/A  . Years of Education: N/A   Occupational History  . Not on file.   Social History Main Topics  . Smoking status: Never Smoker   . Smokeless tobacco: Never Used  . Alcohol Use: No  . Drug Use: No  . Sexual Activity: Yes    Partners: Male    Birth Control/ Protection: Pill     Comment: Lo Estrin   Other Topics Concern  . Not on file   Social History Narrative  . No narrative on file    PHYSICAL EXAMINATION:    BP 110/80  Pulse 60  Resp 18  Ht 5' 7.25" (1.708 m)  Wt 202 lb (91.627 kg)  BMI 31.41 kg/m2   Wt Readings from Last 3 Encounters:  02/20/14 202 lb (91.627 kg)  05/02/13 203 lb (92.08 kg)  02/19/13 202 lb 8 oz (91.853 kg)     Ht Readings from Last 3 Encounters:  02/20/14 5' 7.25" (1.708 m)  05/02/13 5'  7" (1.702 m)  02/19/13  (1.702 m)    General appearance: alert, cooperative and appears stated age Head: Normocephalic, without obvious abnormality, atraumatic Neck: no adenopathy, supple, symmetrical, trachea midline and thyroid not enlarged, symmetric, no tenderness/mass/nodules Lungs: clear to auscultation bilaterally Breasts: Inspection negative, No nipple retraction or dimpling, No nipple discharge or bleeding, No axillary or supraclavicular adenopathy, Normal to palpation without dominant masses Heart: regular rate and rhythm Abdomen: soft, non-tender; no masses,  no organomegaly Extremities: extremities normal, atraumatic, no cyanosis or edema Skin: Skin color, texture, turgor normal. No rashes or lesions Lymph nodes: Cervical, supraclavicular, and axillary nodes normal. No abnormal inguinal nodes palpated Neurologic: Grossly normal  Pelvic: External genitalia:  no lesions              Urethra:  normal appearing urethra with no masses, tenderness or lesions              Bartholins and Skenes: normal                 Vagina: normal appearing vagina with normal color and discharge, no lesions              Cervix: normal appearance              Pap and high risk HPV testing done: No.        Bimanual Exam:  Uterus:  uterus is normal size, shape, consistency and nontender                                      Adnexa: normal adnexa in size, nontender and no masses                                      Rectovaginal:  Yes.  Confirms above.                                      Anus:  normal sphincter tone, no lesions  ASSESSMENT  Normal gynecologic exam. Very light cycles on combined OCPs. History of PACs and hyperlipidemia.  Status post BTL.   PLAN  Mammogram recommended yearly starting at age 63.  Discussed 3D.  Patient will schedule.  Pap smear and high risk HPV testing as above. Counseled on self breast exam. Recommend colonoscopy.   Referral made.  Stop OCPs and then check FSH and estradiol 10 days later. See lab orders: Yes.  Future labs ordered.  Return annually or prn   An After Visit Summary was printed and given to the patient.

## 2014-02-25 ENCOUNTER — Telehealth: Payer: Self-pay | Admitting: Obstetrics and Gynecology

## 2014-02-25 NOTE — Telephone Encounter (Signed)
Spoke with patient. Advised that per message from Dr Trilby Drummer office she is scheduled 10.06.2015 . Provided their telephone # to patient.

## 2014-03-04 ENCOUNTER — Other Ambulatory Visit: Payer: Self-pay

## 2014-03-04 DIAGNOSIS — Z1231 Encounter for screening mammogram for malignant neoplasm of breast: Secondary | ICD-10-CM

## 2014-03-09 ENCOUNTER — Other Ambulatory Visit (INDEPENDENT_AMBULATORY_CARE_PROVIDER_SITE_OTHER): Payer: 59

## 2014-03-09 DIAGNOSIS — N912 Amenorrhea, unspecified: Secondary | ICD-10-CM

## 2014-03-10 ENCOUNTER — Other Ambulatory Visit: Payer: Self-pay | Admitting: Obstetrics and Gynecology

## 2014-03-10 LAB — ESTRADIOL: ESTRADIOL: 26.9 pg/mL

## 2014-03-10 LAB — FOLLICLE STIMULATING HORMONE: FSH: 22 m[IU]/mL

## 2014-03-10 MED ORDER — NORETHIN ACE-ETH ESTRAD-FE 1-20 MG-MCG(24) PO TABS: 1.0000 | ORAL_TABLET | Freq: Every day | ORAL | Status: DC

## 2014-03-13 ENCOUNTER — Other Ambulatory Visit: Payer: 59

## 2014-03-13 ENCOUNTER — Ambulatory Visit: Payer: 59

## 2014-03-17 ENCOUNTER — Encounter: Payer: Self-pay | Admitting: Cardiology

## 2014-04-13 ENCOUNTER — Encounter: Payer: Self-pay | Admitting: Obstetrics and Gynecology

## 2014-04-20 ENCOUNTER — Other Ambulatory Visit: Payer: Self-pay

## 2014-04-20 MED ORDER — NEBIVOLOL HCL 5 MG PO TABS: 5.0000 mg | ORAL_TABLET | Freq: Every day | ORAL | Status: DC

## 2014-04-23 ENCOUNTER — Ambulatory Visit
Admission: RE | Admit: 2014-04-23 | Discharge: 2014-04-23 | Disposition: A | Discharge status: Home / Self Care | Payer: 59 | Source: Ambulatory Visit

## 2014-04-23 DIAGNOSIS — Z1231 Encounter for screening mammogram for malignant neoplasm of breast: Secondary | ICD-10-CM

## 2014-04-27 ENCOUNTER — Other Ambulatory Visit: Payer: Self-pay | Admitting: *Deleted

## 2014-04-27 MED ORDER — SIMVASTATIN 40 MG PO TABS
ORAL_TABLET | ORAL | Status: DC
Start: 2014-04-27 — End: 2014-05-28

## 2014-05-04 ENCOUNTER — Encounter: Payer: Self-pay | Admitting: Cardiology

## 2014-05-04 ENCOUNTER — Ambulatory Visit (INDEPENDENT_AMBULATORY_CARE_PROVIDER_SITE_OTHER): Payer: 59 | Admitting: Cardiology

## 2014-05-04 VITALS — BP 124/62 | HR 59 | Ht 67.5 in | Wt 202.0 lb

## 2014-05-04 DIAGNOSIS — I491 Atrial premature depolarization: Secondary | ICD-10-CM

## 2014-05-04 DIAGNOSIS — E785 Hyperlipidemia, unspecified: Secondary | ICD-10-CM

## 2014-05-04 DIAGNOSIS — R011 Cardiac murmur, unspecified: Secondary | ICD-10-CM

## 2014-05-04 NOTE — Patient Instructions (Signed)
Your physician recommends that you return for FASTING LAB WORK (Lipids and ALT).  Your physician has requested that you have an echocardiogram. Echocardiography is a painless test that uses sound waves to create images of your heart. It provides your doctor with information about the size and shape of your heart and how well your heart's chambers and valves are working. This procedure takes approximately one hour. There are no restrictions for this procedure.  Your physician wants you to follow-up in: 1 year with Dr. Mayford Knifeurner. You will receive a reminder letter in the mail two months in advance. If you don't receive a letter, please call our office to schedule the follow-up appointment.

## 2014-05-04 NOTE — Progress Notes (Signed)
  [address] [place], [place]  [address] Phone: [phone] Fax:  [phone]  Date:  05/04/2014   ID:  Kim AllegraCeleste Diviney, DOB March 12, 1961, MRN [id number]  PCP:  Hollice EspyGATES,DONNA RUTH, MD  Cardiologist:  Armanda Magicraci Turner, MD    History of Present Illness: Kim Young is a 53 y.o. female with a history of PAC's and dyslipidemia. She is doing well. She denies any chest pain, SOB, DOE, LE edema, dizziness  or syncope. She does not exercise.    Wt Readings from Last 3 Encounters:  05/04/14 202 lb (91.627 kg)  02/20/14 202 lb (91.627 kg)  05/02/13 203 lb (92.08 kg)     Past Medical History  Diagnosis Date  . Palpitations   . GERD (gastroesophageal reflux disease)   . Hernia     D. Jarold MottoPatterson  . Dry eye   . Endometriosis   . PAC (premature atrial contraction)   . Hyperlipidemia   . Hyperlipidemia   . Anxiety     Current Outpatient Prescriptions  Medication Sig Dispense Refill  . nebivolol (BYSTOLIC) 5 MG tablet Take 1 tablet (5 mg total) by mouth daily. 90 tablet 1  . Norethindrone Acetate-Ethinyl Estrad-FE (LOESTRIN 24 FE) 1-20 MG-MCG(24) tablet Take 1 tablet by mouth daily. 1 Package 11  . sertraline (ZOLOFT) 100 MG tablet Take 100 mg by mouth daily.    . simvastatin (ZOCOR) 40 MG tablet TAKE ONE TABLET BY MOUTH ONCE DAILY 30 tablet 0   No current facility-administered medications for this visit.    Allergies:    Allergies  Allergen Reactions  . Cymbalta [Duloxetine Hcl]     agitation  . Penicillins     REACTION: unspecified    Social History:  The patient  reports that she has never smoked. She has never used smokeless tobacco. She reports that she does not drink alcohol or use illicit drugs.   Family History:  The patient's family history includes CVA (age of onset: 7075) in her mother; Cancer in her father, maternal grandmother, and sister; Diabetes in her mother; Heart disease in her mother; Hyperlipidemia in her mother; Hypertension in her mother;  Stroke in her mother.   ROS:  Please see the history of present illness.      All other systems reviewed and negative.   PHYSICAL EXAM: VS:  BP 124/62 mmHg  Pulse 59  Ht 5' 7.5" (1.715 m)  Wt 202 lb (91.627 kg)  BMI 31.15 kg/m2 Well nourished, well developed, in no acute distress HEENT: normal Neck: no JVD Cardiac:  normal S1, S2; RRR; 1/6 systolic murmur at RUSB Lungs:  clear to auscultation bilaterally, no wheezing, rhonchi or rales Abd: soft, nontender, no hepatomegaly Ext: no edema Skin: warm and dry Neuro:  CNs 2-12 intact, no focal abnormalities noted  EKG:  Sinus bradycardia at 59bpm with no ST changes, mild LVH by voltage   ASSESSMENT AND PLAN:  1.  PAC's - asymptomatic - continue Bystolic   2.   Dyslipidemia   - continue simvastatin  - check fasting NMR and ALT   3.  Heart murmur - check 2D echo  Followup with me in 1 year       Signed, Armanda Magicraci Turner, MD [facility] 05/04/2014 3:08 PM

## 2014-05-06 ENCOUNTER — Ambulatory Visit (HOSPITAL_COMMUNITY): Payer: 59 | Attending: Cardiology | Admitting: Cardiology

## 2014-05-06 ENCOUNTER — Other Ambulatory Visit (INDEPENDENT_AMBULATORY_CARE_PROVIDER_SITE_OTHER): Payer: 59 | Admitting: *Deleted

## 2014-05-06 DIAGNOSIS — E785 Hyperlipidemia, unspecified: Secondary | ICD-10-CM | POA: Insufficient documentation

## 2014-05-06 DIAGNOSIS — R011 Cardiac murmur, unspecified: Secondary | ICD-10-CM | POA: Diagnosis present

## 2014-05-06 DIAGNOSIS — I10 Essential (primary) hypertension: Secondary | ICD-10-CM | POA: Diagnosis not present

## 2014-05-06 LAB — ALT: ALT: 15 U/L (ref 0–35)

## 2014-05-06 NOTE — Progress Notes (Signed)
Echo performed. 

## 2014-05-09 LAB — NMR LIPOPROFILE WITH LIPIDS
CHOLESTEROL, TOTAL: 268 mg/dL — AB (ref 100–199)
HDL Particle Number: 34 umol/L (ref >=30.5)
HDL Size: 8.4 nm — ABNORMAL LOW (ref >=9.2)
HDL-C: 44 mg/dL (ref >39)
LARGE VLDL-P: 6.6 nmol/L — AB (ref <=2.7)
LDL (calc): 187 mg/dL — ABNORMAL HIGH (ref 0–99)
LDL Particle Number: 2923 nmol/L — ABNORMAL HIGH (ref <1000)
LDL Size: 20.4 nm (ref >=20.8)
LP-IR Score: 87 — ABNORMAL HIGH (ref <=45)
Small LDL Particle Number: 1815 nmol/L — ABNORMAL HIGH (ref <=527)
TRIGLYCERIDES: 185 mg/dL — AB (ref 0–149)
VLDL SIZE: 55.4 nm — AB (ref <=46.6)

## 2014-05-18 ENCOUNTER — Ambulatory Visit (INDEPENDENT_AMBULATORY_CARE_PROVIDER_SITE_OTHER): Payer: 59 | Admitting: Pharmacist

## 2014-05-18 DIAGNOSIS — E785 Hyperlipidemia, unspecified: Secondary | ICD-10-CM

## 2014-05-19 NOTE — Progress Notes (Signed)
HPI  [name] is a 53 yo female patient of Dr. Mayford Young who was referred to the lipid clinic due to elevations in her cholesterol.  She has discussed her cholesterol therapy with Kim Young in the past but it has been >1 year since she has seen him.  She is currently taking simvastatin 40mg  daily.  Her PMH is noncontributory.  Family history is significant for her mother who had a CABG at 9060 and then a stroke at 5274.  She states she has tried several medications in the past but unsure of what all with dosages.  The only things I could find in EPIC were Lipitor (unsure of issue) and then back pain with Crestor 10, 20, and 40mg  daily.  She thinks she may have tried Zetia and/or Vytorin but not sure.    Reviewed pt's diet and exercise habits.  For breakfast she will have a mini bagel or english muffin with peanut butter or cream cheese with raisins.  Lunch is often leftovers or light Progresso soup.  She does not eat deli meat sandwiches.  Dinner is normal meals at home such as chicken pitas, steak and sweet potatoes, pizza, etc.  She does like sweets at least once a day and she snacks on almonds, apples and cashews.  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   RF: Family h/o CAD - Target LDL < 130, LDL-P < 1300 preferably. Meds: Simvastatin 40 mg qd on med list. Intolerance Crestor 10, 20, and 40 mg all cause muscle aches (this problem gets worse as dose gets higher). Patient took Lipitor years ago, but not sure why stopped   Labs:  04/2014: TC 268, TG 185, HDL 44, LDL 187, LDL-P 2923, AST 15 (simvastatin 40mg ) 04/2013: TC 177, TG 153, HDL 42, LDL 104, LDL-P 1858  Current Outpatient Prescriptions  Medication Sig Dispense Refill  . nebivolol (BYSTOLIC) 5 MG tablet Take 1 tablet (5 mg total) by mouth daily. 90 tablet 1  . Norethindrone Acetate-Ethinyl Estrad-FE (LOESTRIN 24 FE) 1-20 MG-MCG(24) tablet Take 1 tablet by mouth daily. 1  Package 11  . sertraline (ZOLOFT) 100 MG tablet Take 100 mg by mouth daily.    . simvastatin (ZOCOR) 40 MG tablet TAKE ONE TABLET BY MOUTH ONCE DAILY 30 tablet 0   No current facility-administered medications for this visit.   Allergies  Allergen Reactions  . Cymbalta [Duloxetine Hcl]     agitation  . Penicillins     REACTION: unspecified

## 2014-05-26 ENCOUNTER — Telehealth: Payer: Self-pay | Admitting: Cardiology

## 2014-05-26 NOTE — Telephone Encounter (Signed)
New Message  Pt returning Kim Young's phone call; please call back and discuss.

## 2014-05-27 NOTE — Assessment & Plan Note (Addendum)
Pt's LDL increased despite no extreme changes in medications and/or lifestyle.  Will not be able to increase simvastatin given risk of myalgias with simvastatin 80mg .  Reviewed records from [facility].  Found records of her failing Crestor.  She was given a prescription for Vytorin 10/40 in December 2013, but when I called the pharmacy, she never had it filled (it was still on hold).  Will have her start Vytorin 10/40 mg and recheck labs in 3 months.  Also discussed cardiac CT with patient to help better risk stratify her based on family history.  She is interested in doing this and okay with Dr. Mayford Knifeurner.

## 2014-05-28 MED ORDER — EZETIMIBE-SIMVASTATIN 10-40 MG PO TABS: 1.0000 | ORAL_TABLET | Freq: Every day | ORAL | Status: DC

## 2014-05-28 NOTE — Addendum Note (Signed)
Addended by: Audrie LiaEARL, Starlyn Droge R on: 05/28/2014 04:00 PM   Modules accepted: Orders, Medications

## 2014-06-25 ENCOUNTER — Ambulatory Visit (INDEPENDENT_AMBULATORY_CARE_PROVIDER_SITE_OTHER)
Admission: RE | Admit: 2014-06-25 | Discharge: 2014-06-25 | Disposition: A | Discharge status: Home / Self Care | Payer: 59 | Source: Ambulatory Visit | Attending: Cardiology | Admitting: Cardiology

## 2014-06-25 DIAGNOSIS — E785 Hyperlipidemia, unspecified: Secondary | ICD-10-CM

## 2014-09-29 ENCOUNTER — Other Ambulatory Visit (INDEPENDENT_AMBULATORY_CARE_PROVIDER_SITE_OTHER): Payer: 59 | Admitting: *Deleted

## 2014-09-29 DIAGNOSIS — E785 Hyperlipidemia, unspecified: Secondary | ICD-10-CM | POA: Diagnosis not present

## 2014-09-29 LAB — HEPATIC FUNCTION PANEL
ALT: 15 U/L (ref 0–35)
AST: 21 U/L (ref 0–37)
Albumin: 3.9 g/dL (ref 3.5–5.2)
Alkaline Phosphatase: 81 U/L (ref 39–117)
Bilirubin, Direct: 0.1 mg/dL (ref 0.0–0.3)
Total Bilirubin: 0.3 mg/dL (ref 0.2–1.2)
Total Protein: 6.7 g/dL (ref 6.0–8.3)

## 2014-09-29 LAB — LIPID PANEL
Cholesterol: 146 mg/dL (ref 0–200)
HDL: 44.1 mg/dL
LDL Cholesterol: 82 mg/dL (ref 0–99)
NonHDL: 101.9
Total CHOL/HDL Ratio: 3
Triglycerides: 100 mg/dL (ref 0.0–149.0)
VLDL: 20 mg/dL (ref 0.0–40.0)

## 2014-09-29 NOTE — Addendum Note (Signed)
Addended by: Colbert Curenton K on: 09/29/2014 07:40 AM   Modules accepted: Orders  

## 2014-09-29 NOTE — Addendum Note (Signed)
Addended by: Tonita PhoenixBOWDEN, ROBIN K on: 09/29/2014 07:40 AM   Modules accepted: Orders

## 2014-09-29 NOTE — Addendum Note (Signed)
Addended by: Tonita PhoenixBOWDEN, Kinzlee Selvy K on: 09/29/2014 07:39 AM   Modules accepted: Orders

## 2014-09-30 ENCOUNTER — Telehealth: Payer: Self-pay

## 2014-09-30 MED ORDER — EZETIMIBE-SIMVASTATIN 10-40 MG PO TABS: 1.0000 | ORAL_TABLET | Freq: Every day | ORAL | Status: DC

## 2014-09-30 NOTE — Telephone Encounter (Signed)
-----   Message from Quintella Reichertraci R Turner, MD sent at 09/29/2014  3:16 PM EDT ----- Lipids at goal continue current therapy

## 2014-09-30 NOTE — Telephone Encounter (Signed)
Informed patient of results and verbal understanding expressed.  Vytorin Rx refill sent per patient request.  Instruction letter for MyChart sent per patient request.

## 2014-10-31 ENCOUNTER — Other Ambulatory Visit: Payer: Self-pay | Admitting: Cardiology

## 2014-12-29 ENCOUNTER — Other Ambulatory Visit: Payer: Self-pay | Admitting: Pharmacist

## 2014-12-29 MED ORDER — EZETIMIBE-SIMVASTATIN 10-40 MG PO TABS: 1.0000 | ORAL_TABLET | Freq: Every day | ORAL | Status: DC

## 2015-01-11 ENCOUNTER — Ambulatory Visit (INDEPENDENT_AMBULATORY_CARE_PROVIDER_SITE_OTHER): Payer: 59 | Admitting: Podiatry

## 2015-01-11 ENCOUNTER — Ambulatory Visit (INDEPENDENT_AMBULATORY_CARE_PROVIDER_SITE_OTHER): Payer: 59

## 2015-01-11 ENCOUNTER — Encounter: Payer: Self-pay | Admitting: Podiatry

## 2015-01-11 VITALS — BP 116/73 | HR 67 | Resp 16 | Ht 67.0 in | Wt 200.0 lb

## 2015-01-11 DIAGNOSIS — M205X2 Other deformities of toe(s) (acquired), left foot: Secondary | ICD-10-CM | POA: Diagnosis not present

## 2015-01-11 DIAGNOSIS — M779 Enthesopathy, unspecified: Secondary | ICD-10-CM

## 2015-01-11 DIAGNOSIS — M2012 Hallux valgus (acquired), left foot: Secondary | ICD-10-CM

## 2015-01-11 DIAGNOSIS — M722 Plantar fascial fibromatosis: Secondary | ICD-10-CM | POA: Diagnosis not present

## 2015-01-11 MED ORDER — METHYLPREDNISOLONE 4 MG PO TBPK: ORAL_TABLET | ORAL | Status: DC

## 2015-01-11 MED ORDER — MELOXICAM 15 MG PO TABS: 15.0000 mg | ORAL_TABLET | Freq: Every day | ORAL | Status: DC

## 2015-01-11 NOTE — Patient Instructions (Signed)

## 2015-01-11 NOTE — Progress Notes (Signed)
   Subjective:    Patient ID: Kim Young, female    DOB: 26-Nov-1960, 54 y.o.   MRN: [id number]  HPI I have a bum left foot, this bone right here hurts and the second toe on the left foot has some burning sensations.  My heel also hurts , feels like a bruise on it . This has been going on for a long time, the bunion started hurting about 3-4 months. Shoes are bothering me to wear     Review of Systems     Objective:   Physical Exam: I have reviewed her past medical history medications allergy surgery social history review of systems and chief complaint. Pulses are strongly palpable bilateral. Neurologic sensorium is intact per Semmes-Weinstein monofilament. Deep tendon reflexes are intact bilateral and muscle strength +5 over 5 dorsiflexion and plantar flexors and inverters everters all edges of musculature is intact. Orthopedic evaluation of his resolved joints distal to the ankle and full range of motion without crepitation. She has pain on palpation medial calcaneal tubercle of the left heel without renal medial and lateral compression of the calcaneus. She also has tenderness on range of motion particularly in range of motion of the second metatarsophalangeal joint of the left foot. She has limited range of motion with tenderness and a small spur to the dorsal aspect which is tender to the first metatarsophalangeal joint of the left foot. Radiographs confirm soft tissue increase in density at the plantar fascial calcaneal insertion site. Elongated second metatarsal with medial deviation of the second digit left foot. Joint space narrowing first metatarsophalangeal joint left with dorsal exostoses on lateral view of the radiographs. Cutaneous evaluation demonstrates supple well-hydrated cutis no erythema edema saline as drainage or odor.        Assessment & Plan:  Assessment: Chronic intractable plantar fasciitis left heel. Plantarflexed elongated second metatarsal with capsulitis of the  second metatarsophalangeal joint and pre-dislocation syndrome second digit left foot. 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  Plan: Discussed etiology pathology conservative versus surgical therapies. I encouraged her to continue to wear her old orthotics. I also injected her left heel today with Kenalog and local and aesthetic and the second metatarsophalangeal joint with dexamethasone and local and aesthetic after sterile Betadine skin prep. I also placed her in a plantar fascial brace and night splint. Discussed appropriate shoe gear stretching exercises ice therapy issue gear modifications. Dispensed a prescription for Medrol Dosepak to be followed by meloxicam and I will follow-up with her in 1 month at which time we may consider new orthoses.

## 2015-01-12 ENCOUNTER — Telehealth: Payer: Self-pay | Admitting: Podiatry

## 2015-01-12 NOTE — Telephone Encounter (Signed)
Pt called and has some questions about the medications that were prescribed by Dr Al Corpus yesterday and would like a call back for clarification.

## 2015-01-12 NOTE — Telephone Encounter (Signed)
Called patient back , left voicemail to call me back at [phone] ext [phone]

## 2015-01-22 ENCOUNTER — Encounter: Payer: Self-pay | Admitting: Cardiology

## 2015-02-04 ENCOUNTER — Other Ambulatory Visit: Payer: Self-pay | Admitting: Cardiology

## 2015-02-12 ENCOUNTER — Other Ambulatory Visit: Payer: Self-pay | Admitting: Cardiology

## 2015-02-12 ENCOUNTER — Other Ambulatory Visit: Payer: Self-pay

## 2015-02-12 MED ORDER — EZETIMIBE-SIMVASTATIN 10-40 MG PO TABS: 1.0000 | ORAL_TABLET | Freq: Every day | ORAL | Status: DC

## 2015-02-12 MED ORDER — NEBIVOLOL HCL 5 MG PO TABS: 5.0000 mg | ORAL_TABLET | Freq: Every day | ORAL | Status: DC

## 2015-02-12 MED ORDER — NORETHIN ACE-ETH ESTRAD-FE 1-20 MG-MCG(24) PO TABS: 1.0000 | ORAL_TABLET | Freq: Every day | ORAL | Status: DC

## 2015-02-12 NOTE — Telephone Encounter (Signed)
Medication refill request:  Lo Losertin Last AEX:  02/20/14 with BS Next AEX: 03/03/15 with BS Last MMG (if hormonal medication request): 04/23/14 3D dense category c, bi-rads category 1 neg. Refill authorized: please advise

## 2015-02-17 ENCOUNTER — Ambulatory Visit (INDEPENDENT_AMBULATORY_CARE_PROVIDER_SITE_OTHER): Payer: 59 | Admitting: Podiatry

## 2015-02-17 ENCOUNTER — Encounter: Payer: Self-pay | Admitting: Podiatry

## 2015-02-17 VITALS — BP 96/54 | HR 56 | Resp 16

## 2015-02-17 DIAGNOSIS — M205X2 Other deformities of toe(s) (acquired), left foot: Secondary | ICD-10-CM

## 2015-02-17 DIAGNOSIS — M722 Plantar fascial fibromatosis: Secondary | ICD-10-CM | POA: Diagnosis not present

## 2015-02-17 DIAGNOSIS — M779 Enthesopathy, unspecified: Secondary | ICD-10-CM | POA: Diagnosis not present

## 2015-02-17 NOTE — Progress Notes (Signed)
She presents today for follow-up of capsulitis second metatarsal and first metatarsophalangeal joint. She also presents for follow-up of plantar fasciitis. She states that she is doing much better and much improved as far as the heel goes. However she is still having soreness at the second and first metatarsophalangeal joint particularly with burning pain.  Objective: Vital sign alert and oriented 3. Pulses are strongly palpable. She has no pain on palpation medial calcaneal tubercle of the left heel. She still has pain on palpation at the second MTPJ and the first metatarsophalangeal joint left foot.  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 Resolving plantar fasciitis left.  Plan: She will continue conservative therapies and medications. I will follow-up with her again in 1 month to discuss surgical intervention regarding this left foot. This would consist of a shortening second metatarsal osteotomy and Jerrol Banana with osteotomy and possible EPF.

## 2015-03-03 ENCOUNTER — Encounter: Payer: Self-pay | Admitting: Obstetrics and Gynecology

## 2015-03-03 ENCOUNTER — Ambulatory Visit (INDEPENDENT_AMBULATORY_CARE_PROVIDER_SITE_OTHER): Payer: 59 | Admitting: Obstetrics and Gynecology

## 2015-03-03 VITALS — BP 110/68 | HR 76 | Resp 16 | Ht 67.0 in | Wt 197.6 lb

## 2015-03-03 DIAGNOSIS — Z01419 Encounter for gynecological examination (general) (routine) without abnormal findings: Secondary | ICD-10-CM | POA: Diagnosis not present

## 2015-03-03 DIAGNOSIS — Z0000 Encounter for general adult medical examination without abnormal findings: Secondary | ICD-10-CM

## 2015-03-03 DIAGNOSIS — N951 Menopausal and female climacteric states: Secondary | ICD-10-CM | POA: Diagnosis not present

## 2015-03-03 LAB — POCT URINALYSIS DIPSTICK
Bilirubin, UA: NEGATIVE
Glucose, UA: NEGATIVE
Ketones, UA: NEGATIVE
Nitrite, UA: NEGATIVE
PH UA: 5
PROTEIN UA: NEGATIVE
Urobilinogen, UA: NEGATIVE

## 2015-03-03 NOTE — Progress Notes (Signed)
Patient ID: Kim Young, female   DOB: [birth year], 54 y.o.   MRN: [id number] 53 y.o. G68P2002 Married Caucasian female here for annual exam.    Not really having cycles.  Barely has spotting.   No hx of HTN.   PCP:   Shaune Pollack, MD  Patient's last menstrual period was 02/22/2015 (exact date).          Sexually active: Yes.   female partner The current method of family planning is OCP (estrogen/progesterone)--Loestrin Fe.    Exercising: No.   Smoker:  no  Health Maintenance: Pap:  02-19-13 Neg:Neg HR HPV History of abnormal Pap:  no MMG:  04-23-14 3D Density Cat.C/Neg:[facility] Colonoscopy:  2015 normal with Dr. Loreta Ave. Next due 2025. BMD:   n/a  Result  n/a TDaP:  PCP Screening Labs:  Hb today: with Cardiologist, Urine today: Trace WBCs and Trace RBCs--asymptomatic   reports that she has never smoked. She has never used smokeless tobacco. She reports that she does not drink alcohol or use illicit drugs.  Past Medical History  Diagnosis Date  . Palpitations   . GERD (gastroesophageal reflux disease)   . Hernia     D. Jarold Motto  . Dry eye   . Endometriosis   . PAC (premature atrial contraction)   . Hyperlipidemia   . Hyperlipidemia   . Anxiety     Past Surgical History  Procedure Laterality Date  . Hemorrhoid surgery    . Tubal ligation    . Cesarean section  1998&2000    x2  . Laparoscopy      endometriosis    Current Outpatient Prescriptions  Medication Sig Dispense Refill  . ezetimibe-simvastatin (VYTORIN) 10-40 MG per tablet Take 1 tablet by mouth daily. 90 tablet 0  . meloxicam (MOBIC) 15 MG tablet Take 1 tablet (15 mg total) by mouth daily. 30 tablet 3  . nebivolol (BYSTOLIC) 5 MG tablet Take 1 tablet (5 mg total) by mouth daily. 90 tablet 0  . Norethindrone Acetate-Ethinyl Estrad-FE (LOESTRIN 24 FE) 1-20 MG-MCG(24) tablet Take 1 tablet by mouth daily. 1 Package 0  . sertraline (ZOLOFT) 100 MG tablet Take 100 mg by mouth daily.     No current  facility-administered medications for this visit.    Family History  Problem Relation Age of Onset  . Heart disease Mother   . Stroke Mother   . Diabetes Mother   . CVA Mother 24    on dialysis  . Hypertension Mother   . Hyperlipidemia Mother   . Cancer Father     pancreatic  . Cancer Sister     Cervical  . Cancer Maternal Grandmother     Lung    ROS:  Pertinent items are noted in HPI.  Otherwise, a comprehensive ROS was negative.  Exam:   BP 110/68 mmHg  Pulse 76  Resp 16  Ht  (1.702 m)  Wt 197 lb 9.6 oz (89.631 kg)  BMI 30.94 kg/m2  LMP 02/22/2015 (Exact Date)    General appearance: alert, cooperative and appears stated age Head: Normocephalic, without obvious abnormality, atraumatic Neck: no adenopathy, supple, symmetrical, trachea midline and thyroid normal to inspection and palpation Lungs: clear to auscultation bilaterally Breasts: normal appearance, no masses or tenderness, Inspection negative, No nipple retraction or dimpling, No nipple discharge or bleeding, No axillary or supraclavicular adenopathy Heart: regular rate and rhythm Abdomen: soft, non-tender; bowel sounds normal; no masses,  no organomegaly Extremities: extremities normal, atraumatic, no cyanosis or edema Skin:  Skin color, texture, turgor normal. No rashes or lesions Lymph nodes: Cervical, supraclavicular, and axillary nodes normal. No abnormal inguinal nodes palpated Neurologic: Grossly normal  Pelvic: External genitalia:  no lesions              Urethra:  normal appearing urethra with no masses, tenderness or lesions              Bartholins and Skenes: normal                 Vagina: normal appearing vagina with normal color and discharge, no lesions              Cervix: no lesions              Pap taken: No. Bimanual Exam:  Uterus:  normal size, contour, position, consistency, mobility, non-tender              Adnexa: normal adnexa and no mass, fullness, tenderness               Rectovaginal: Yes.  .  Confirms.              Anus:  normal sphincter tone, no lesions  Chaperone was present for exam.  Assessment:   Well woman visit with normal exam. On combined OCPs.  Taking Bystolic for palpitations.  Plan: Yearly mammogram recommended after age 70.  Recommended self breast exam.  Pap and HR HPV as above. Discussed Calcium, Vitamin D, regular exercise program including cardiovascular and weight bearing exercise. Labs performed.  Yes.  .   See orders.  Will do future FSH and estradiol when off OCPs for 2 weeks.  Refills given on medications.  No..  No OCP refills at this time.   Will await lab results. Follow up annually and prn.      After visit summary provided.

## 2015-03-03 NOTE — Patient Instructions (Signed)

## 2015-03-16 ENCOUNTER — Ambulatory Visit (INDEPENDENT_AMBULATORY_CARE_PROVIDER_SITE_OTHER): Payer: 59 | Admitting: Family Medicine

## 2015-03-16 VITALS — BP 98/68 | HR 53 | Temp 98.2°F | Resp 16 | Ht 67.0 in | Wt 196.0 lb

## 2015-03-16 DIAGNOSIS — H8113 Benign paroxysmal vertigo, bilateral: Secondary | ICD-10-CM

## 2015-03-16 DIAGNOSIS — R110 Nausea: Secondary | ICD-10-CM

## 2015-03-16 LAB — COMPLETE METABOLIC PANEL WITH GFR
ALT: 30 U/L — ABNORMAL HIGH (ref 6–29)
CO2: 27 mmol/L (ref 20–31)
Chloride: 105 mmol/L (ref 98–110)
Creat: 0.79 mg/dL (ref 0.50–1.05)
GFR, Est African American: >89 mL/min (ref >=60)
GFR, Est Non African American: 86 mL/min (ref >=60)
Potassium: 4.8 mmol/L (ref 3.5–5.3)
Sodium: 140 mmol/L (ref 135–146)
Total Bilirubin: 0.6 mg/dL (ref 0.2–1.2)
Total Protein: 6.8 g/dL (ref 6.1–8.1)

## 2015-03-16 LAB — POCT CBC
Granulocyte percent: 67.3 %G (ref 37–80)
HCT, POC: 44.4 % (ref 37.7–47.9)
Hemoglobin: 14.4 g/dL (ref 12.2–16.2)
Lymph, poc: 1.7 (ref 0.6–3.4)
MCH, POC: 29.2 pg (ref 27–31.2)
MCHC: 32.4 g/dL (ref 31.8–35.4)
MCV: 90 fL (ref 80–97)
MID (cbc): 0.3 (ref 0–0.9)
MPV: 7 fL (ref 0–99.8)
POC Granulocyte: 4.2 (ref 2–6.9)
POC LYMPH PERCENT: 27.4 % (ref 10–50)
POC MID %: 5.3 %M (ref 0–12)
Platelet Count, POC: 266 10*3/uL (ref 142–424)
RBC: 4.93 M/uL (ref 4.04–5.48)
RDW, POC: 13.9 %
WBC: 6.3 10*3/uL (ref 4.6–10.2)

## 2015-03-16 LAB — COMPLETE METABOLIC PANEL WITHOUT GFR
AST: 26 U/L (ref 10–35)
Albumin: 4.3 g/dL (ref 3.6–5.1)
Alkaline Phosphatase: 75 U/L (ref 33–130)
BUN: 17 mg/dL (ref 7–25)
Calcium: 9.5 mg/dL (ref 8.6–10.4)
Glucose, Bld: 87 mg/dL (ref 65–99)

## 2015-03-16 LAB — GLUCOSE, POCT (MANUAL RESULT ENTRY): POC Glucose: 84 mg/dL (ref 70–99)

## 2015-03-16 MED ORDER — ONDANSETRON 4 MG PO TBDP
4.0000 mg | ORAL_TABLET | Freq: Three times a day (TID) | ORAL | Status: DC | PRN
Start: 2015-03-16 — End: 2015-05-28

## 2015-03-16 MED ORDER — MECLIZINE HCL 25 MG PO TABS
25.0000 mg | ORAL_TABLET | Freq: Three times a day (TID) | ORAL | Status: DC | PRN
Start: 2015-03-16 — End: 2015-05-28

## 2015-03-16 NOTE — Patient Instructions (Signed)
Benign Positional Vertigo Vertigo means you feel like you or your surroundings are moving when they are not. Benign positional vertigo is the most common form of vertigo. Benign means that the cause of your condition is not serious. Benign positional vertigo is more common in older adults. CAUSES  Benign positional vertigo is the result of an upset in the labyrinth system. This is an area in the middle ear that helps control your balance. This may be caused by a viral infection, head injury, or repetitive motion. However, often no specific cause is found. SYMPTOMS  Symptoms of benign positional vertigo occur when you move your head or eyes in different directions. Some of the symptoms may include:  Loss of balance and falls.  Vomiting.  Blurred vision.  Dizziness.  Nausea.  Involuntary eye movements (nystagmus). DIAGNOSIS  Benign positional vertigo is usually diagnosed by physical exam. If the specific cause of your benign positional vertigo is unknown, your caregiver may perform imaging tests, such as magnetic resonance imaging (MRI) or computed tomography (CT). TREATMENT  Your caregiver may recommend movements or procedures to correct the benign positional vertigo. Medicines such as meclizine, benzodiazepines, and medicines for nausea may be used to treat your symptoms. In rare cases, if your symptoms are caused by certain conditions that affect the inner ear, you may need surgery. HOME CARE INSTRUCTIONS   Follow your caregiver's instructions.  Move slowly. Do not make sudden body or head movements.  Avoid driving.  Avoid operating heavy machinery.  Avoid performing any tasks that would be dangerous to you or others during a vertigo episode.  Drink enough fluids to keep your urine clear or pale yellow. SEEK IMMEDIATE MEDICAL CARE IF:   You develop problems with walking, weakness, numbness, or using your arms, hands, or legs.  You have difficulty speaking.  You develop  severe headaches.  Your nausea or vomiting continues or gets worse.  You develop visual changes.  Your family or friends notice any behavioral changes.  Your condition gets worse.  You have a fever.  You develop a stiff neck or sensitivity to light. MAKE SURE YOU:   Understand these instructions.  Will watch your condition.  Will get help right away if you are not doing well or get worse. Document Released: 03/06/2006 Document Revised: 08/21/2011 Document Reviewed: 02/16/2011 ExitCare Patient Information 2015 [employer]. This information is not intended to replace advice given to you by your health care provider. Make sure you discuss any questions you have with your health care provider.   Vertigo Vertigo means you feel like you or your surroundings are moving when they are not. Vertigo can be dangerous if it occurs when you are at work, driving, or performing difficult activities.  CAUSES  Vertigo occurs when there is a conflict of signals sent to your brain from the visual and sensory systems in your body. There are many different causes of vertigo, including:  Infections, especially in the inner ear.  A bad reaction to a drug or misuse of alcohol and medicines.  Withdrawal from drugs or alcohol.  Rapidly changing positions, such as lying down or rolling over in bed.  A migraine headache.  Decreased blood flow to the brain.  Increased pressure in the brain from a head injury, infection, tumor, or bleeding. SYMPTOMS  You may feel as though the world is spinning around or you are falling to the ground. Because your balance is upset, vertigo can cause nausea and vomiting. You may have involuntary eye   movements (nystagmus). DIAGNOSIS  Vertigo is usually diagnosed by physical exam. If the cause of your vertigo is unknown, your caregiver may perform imaging tests, such as an MRI scan (magnetic resonance imaging). TREATMENT  Most cases of vertigo resolve on their own,  without treatment. Depending on the cause, your caregiver may prescribe certain medicines. If your vertigo is related to body position issues, your caregiver may recommend movements or procedures to correct the problem. In rare cases, if your vertigo is caused by certain inner ear problems, you may need surgery. HOME CARE INSTRUCTIONS   Follow your caregiver's instructions.  Avoid driving.  Avoid operating heavy machinery.  Avoid performing any tasks that would be dangerous to you or others during a vertigo episode.  Tell your caregiver if you notice that certain medicines seem to be causing your vertigo. Some of the medicines used to treat vertigo episodes can actually make them worse in some people. SEEK IMMEDIATE MEDICAL CARE IF:   Your medicines do not relieve your vertigo or are making it worse.  You develop problems with talking, walking, weakness, or using your arms, hands, or legs.  You develop severe headaches.  Your nausea or vomiting continues or gets worse.  You develop visual changes.  A family member notices behavioral changes.  Your condition gets worse. MAKE SURE YOU:  Understand these instructions.  Will watch your condition.  Will get help right away if you are not doing well or get worse. Document Released: 03/08/2005 Document Revised: 08/21/2011 Document Reviewed: 12/15/2010 ExitCare Patient Information 2015 [employer]. This information is not intended to replace advice given to you by your health care provider. Make sure you discuss any questions you have with your health care provider.  

## 2015-03-16 NOTE — Progress Notes (Signed)
Chief Complaint:  Chief Complaint  Patient presents with  . Dizziness    Onset last night  . Nausea    HPI: Kim Young is a 54 y.o. female who reports to [facility] today complaining of vertigo sxs, when she was in bed.  When she moves her head she feels dizzy, no HA currently , no vision changes, no  nausea.   No CP , no stroke sxs, no URI sxs.  She felt the HA was more  tension behind her eyes. She has no HTN, DM, does have well controlled hyperlipidemia She has had nausea, has not eaten since last night, did drink water today, has never had dizziness, deneis any CP or SOB sxs or palpitations, she is on nystolic for this and also zoloft.  Last echo in 2015 was normal, followed by cardiology Dr Mayford Knife for palpitations and also heart murmur. Again she deneis any CP or palpitations or cardiac sxs, no msk w/n/t No new meds, feels "swimmy headed" , worse with head movement No urinary sxs , no vaginal sxs.    Past Medical History  Diagnosis Date  . Palpitations   . GERD (gastroesophageal reflux disease)   . Hernia     D. Jarold Motto  . Dry eye   . Endometriosis   . PAC (premature atrial contraction)   . Hyperlipidemia   . Hyperlipidemia   . Anxiety    Past Surgical History  Procedure Laterality Date  . Hemorrhoid surgery    . Tubal ligation    . Cesarean section  1998&2000    x2  . Laparoscopy      endometriosis   Social History   Social History  . Marital Status: Married    Spouse Name: N/A  . Number of Children: N/A  . Years of Education: N/A   Social History Main Topics  . Smoking status: Never Smoker   . Smokeless tobacco: Never Used  . Alcohol Use: No  . Drug Use: No  . Sexual Activity:    Partners: Male    Birth Control/ Protection: Pill     Comment: Lo Estrin   Other Topics Concern  . None   Social History Narrative   Family History  Problem Relation Age of Onset  . Heart disease Mother   . Stroke Mother   . Diabetes Mother   . CVA Mother  29    on dialysis  . Hypertension Mother   . Hyperlipidemia Mother   . Cancer Father     pancreatic  . Cancer Sister     Cervical  . Cancer Maternal Grandmother     Lung   Allergies  Allergen Reactions  . Cymbalta [Duloxetine Hcl]     agitation  . Penicillins     REACTION: unspecified   Prior to Admission medications   Medication Sig Start Date End Date Taking? Authorizing Provider  ezetimibe-simvastatin (VYTORIN) 10-40 MG per tablet Take 1 tablet by mouth daily. 02/12/15  Yes Quintella Reichert, MD  meloxicam (MOBIC) 15 MG tablet Take 1 tablet (15 mg total) by mouth daily. 01/11/15  Yes Max T Hyatt, DPM  nebivolol (BYSTOLIC) 5 MG tablet Take 1 tablet (5 mg total) by mouth daily. 02/12/15  Yes Quintella Reichert, MD  sertraline (ZOLOFT) 100 MG tablet Take 100 mg by mouth daily.   Yes Historical Provider, MD  Norethindrone Acetate-Ethinyl Estrad-FE (LOESTRIN 24 FE) 1-20 MG-MCG(24) tablet Take 1 tablet by mouth daily. Patient not taking: Reported on 03/16/2015  02/12/15   Brook Rosalin Hawking, MD     ROS: The patient denies fevers, chills, night sweats, unintentional weight loss, chest pain, palpitations, wheezing, dyspnea on exertion,  vomiting, abdominal pain, dysuria, hematuria, melena, numbness, weakness, or tingling.   All other systems have been reviewed and were otherwise negative with the exception of those mentioned in the HPI and as above.    PHYSICAL EXAM: Filed Vitals:   03/16/15 1010  BP: 98/68  Pulse: 53  Temp: 98.2 F (36.8 C)  Resp: 16   Body mass index is 30.69 kg/(m^2).   General: Alert, no acute distress HEENT:  Normocephalic, atraumatic, oropharynx patent. EOMI, PERRLA. Neg nystagmus [name] Fundoscopic exam is normal , Tm normal  Cardiovascular:  Regular  Rhythm, sinus brady,  no rubs murmurs or gallops.  No Carotid bruits, radial pulse intact. No pedal edema.  Respiratory: Clear to auscultation bilaterally.  No wheezes, rales, or rhonchi.  No cyanosis, no  use of accessory musculature Abdominal: No organomegaly, abdomen is soft and non-tender, positive bowel sounds. No masses. Skin: No rashes. Neurologic: Facial musculature symmetric. CN 2-12 grossly nl, neg Romberg, neg finger to nose.  Psychiatric: Patient acts appropriately throughout our interaction. Lymphatic: No cervical or submandibular lymphadenopathy Musculoskeletal: Gait intact. No edema, tenderness 5/5 strength, 2/2 DTRs.    LABS: Results for orders placed or performed in visit on 03/16/15  POCT CBC  Result Value Ref Range   WBC 6.3 4.6 - 10.2 K/uL   Lymph, poc 1.7 0.6 - 3.4   POC LYMPH PERCENT 27.4 10 - 50 %L   MID (cbc) 0.3 0 - 0.9   POC MID % 5.3 0 - 12 %M   POC Granulocyte 4.2 2 - 6.9   Granulocyte percent 67.3 37 - 80 %G   RBC 4.93 4.04 - 5.48 M/uL   Hemoglobin 14.4 12.2 - 16.2 g/dL   HCT, POC 40.9 81.1 - 47.9 %   MCV 90.0 80 - 97 fL   MCH, POC 29.2 27 - 31.2 pg   MCHC 32.4 31.8 - 35.4 g/dL   RDW, POC 91.4 %   Platelet Count, POC 266 142 - 424 K/uL   MPV 7.0 0 - 99.8 fL  POCT glucose (manual entry)  Result Value Ref Range   POC Glucose 84 70 - 99 mg/dl     EKG/XRAY:   Primary read interpreted by Dr. Conley Rolls at [facility].   ASSESSMENT/PLAN: Encounter Diagnoses  Name Primary?  . Benign paroxysmal positional vertigo, bilateral Yes  . Nausea without vomiting    Rx meclizine Rx zofran Neuro exam normal, orthostatics normal laying 108/69, 52; sitting 105/69, 52; 115/75, 55 Fu prn , precautions given   Gross sideeffects, risk and benefits, and alternatives of medications d/w patient. Patient is aware that all medications have potential sideeffects and we are unable to predict every sideeffect or drug-drug interaction that may occur.  [name] DO  03/16/2015 2:43 PM

## 2015-03-17 ENCOUNTER — Telehealth: Payer: Self-pay | Admitting: Obstetrics and Gynecology

## 2015-03-17 ENCOUNTER — Other Ambulatory Visit: Payer: 59

## 2015-03-17 NOTE — Telephone Encounter (Signed)
Returning a call to Kaitlyn. °

## 2015-03-17 NOTE — Telephone Encounter (Signed)
Please inform patient will need to return for her lab work here for hormonal testing.  Future orders have been placed.  She was to be off her pills for 2 weeks prior to this.

## 2015-03-17 NOTE — Telephone Encounter (Signed)
Patient called and cancelled her appointment today for "labs" due to "vertigo." She said, "I did have some lab work done yesterday at [facility] ([facility] of [facility]) so maybe I don't even need to reschedule. Please ask Dr. Edward Jolly to check on these labs and call me with further instruction."

## 2015-03-17 NOTE — Telephone Encounter (Signed)
Spoke with patient. Advised of message as seen below from Dr.Silva. Patient is agreeable. Has been off OCP for 2 weeks. Lab appointment rescheduled to 10/7 at 4:10 pm. Agreeable to date and time.  Routing to provider for final review. Patient agreeable to disposition. Will close encounter.

## 2015-03-17 NOTE — Telephone Encounter (Signed)
Left message to call Chari Parmenter at [phone]. 

## 2015-03-19 ENCOUNTER — Other Ambulatory Visit (INDEPENDENT_AMBULATORY_CARE_PROVIDER_SITE_OTHER): Payer: 59

## 2015-03-19 DIAGNOSIS — N951 Menopausal and female climacteric states: Secondary | ICD-10-CM

## 2015-03-20 ENCOUNTER — Other Ambulatory Visit: Payer: Self-pay | Admitting: Obstetrics and Gynecology

## 2015-03-20 LAB — ESTRADIOL: ESTRADIOL: 15.2 pg/mL

## 2015-03-20 LAB — FOLLICLE STIMULATING HORMONE: FSH: 27.7 m[IU]/mL

## 2015-03-20 MED ORDER — NORETHIN ACE-ETH ESTRAD-FE 1-20 MG-MCG(24) PO TABS: 1.0000 | ORAL_TABLET | Freq: Every day | ORAL | Status: DC

## 2015-03-24 ENCOUNTER — Other Ambulatory Visit: Payer: Self-pay

## 2015-03-24 ENCOUNTER — Ambulatory Visit (INDEPENDENT_AMBULATORY_CARE_PROVIDER_SITE_OTHER): Payer: 59 | Admitting: Podiatry

## 2015-03-24 ENCOUNTER — Encounter: Payer: Self-pay | Admitting: Podiatry

## 2015-03-24 VITALS — BP 101/58 | HR 57

## 2015-03-24 DIAGNOSIS — M722 Plantar fascial fibromatosis: Secondary | ICD-10-CM

## 2015-03-24 DIAGNOSIS — M779 Enthesopathy, unspecified: Secondary | ICD-10-CM | POA: Diagnosis not present

## 2015-03-24 DIAGNOSIS — M205X2 Other deformities of toe(s) (acquired), left foot: Secondary | ICD-10-CM | POA: Diagnosis not present

## 2015-03-24 DIAGNOSIS — Z1231 Encounter for screening mammogram for malignant neoplasm of breast: Secondary | ICD-10-CM

## 2015-03-24 MED ORDER — DICLOFENAC SODIUM 75 MG PO TBEC: 75.0000 mg | DELAYED_RELEASE_TABLET | Freq: Two times a day (BID) | ORAL | Status: DC

## 2015-03-25 NOTE — Progress Notes (Signed)
She presents today for surgical consult regarding her left foot. She states that the first metatarsophalangeal joint really has not improved at all she states that I really don't feel it that much because of walking on the outside of my foot started to have more pain around the second metatarsophalangeal joint and heel is doing some better. She denies changes in her past medical history medications allergies surgery social history and review of systems.  Objective: Vital signs are stable she is alert and oriented 613. 54 year old white female in no apparent distress vital signs stable alert and oriented 3 pulses are strongly palpable. Severe hallux limitus first metatarsophalangeal joint left foot. Pain on palpation and in range of motion of the second metatarsophalangeal joint left foot show that she continues to have pain on palpation medial calcaneal tubercle of the left heel. Cutaneous evaluation demonstrates supple well-hydrated cutis no erythema edema saline as drainage or odor. Neurologic sensorium is intact. Deep tendon reflexes are brisk and equal bilateral.  Assessment hallux valgus with hallux limitus first metatarsophalangeal joint left foot. Plantar flexed elongated metatarsal with capsulitis and mild hammertoe deformity second metatarsophalangeal joint left. Chronic proximal plantar fasciitis left foot.  Plan: We discussed the etiology pathology conservative versus surgical therapies. We discussed appropriate surgical procedures which consist of either an Austin decompression osteotomy or Keller arthroplasty depending on the joint. We also discussed a second metatarsal osteotomy as well as a endoscopic plantar fasciotomy. I answered all the questions regarding these procedures to the best of my ability in layman's terms. We also discussed the possible postoperative complications which may include but are not limited to postop pain bleeding swelling infection recurrence need for further surgery  loss of limb loss of life loss of digit. We dispensed a cam walker today for her surgical correction and I will follow-up with her in the near future for surgical intervention. This will be performed on an outpatient basis.

## 2015-04-08 ENCOUNTER — Telehealth: Payer: Self-pay | Admitting: *Deleted

## 2015-04-08 NOTE — Telephone Encounter (Signed)
I left messages for patient to give me a call.  I need to see if she wants to schedule surgery with Dr. Al CorpusHyatt.

## 2015-04-15 ENCOUNTER — Telehealth: Payer: Self-pay | Admitting: *Deleted

## 2015-04-15 NOTE — Telephone Encounter (Addendum)
Pt states the 2nd medication Dr. Al CorpusHyatt prescribed is bothering her stomach.  I reviewed pt's record and the Diclofenac was the last medication prescribed.  Left message with Dr. Geryl RankinsHyatt's recommendations to either use the Mobic or switch to Diclofenac Gel.

## 2015-04-17 ENCOUNTER — Other Ambulatory Visit: Payer: Self-pay | Admitting: Cardiology

## 2015-04-21 NOTE — Telephone Encounter (Signed)
She said that mobic really was not helping so we changed the drug to diclofenac.  You can prescribe diclofenac gel if you wish that way we have no stomach issues at all.

## 2015-04-22 NOTE — Telephone Encounter (Signed)
I left a message for patient to give me a call.  I want to see if you would like to schedule your procedure.

## 2015-04-23 ENCOUNTER — Ambulatory Visit: Payer: 59 | Attending: Family Medicine | Admitting: Rehabilitative and Restorative Service Providers"

## 2015-04-23 DIAGNOSIS — H8113 Benign paroxysmal vertigo, bilateral: Secondary | ICD-10-CM | POA: Insufficient documentation

## 2015-04-23 DIAGNOSIS — R269 Unspecified abnormalities of gait and mobility: Secondary | ICD-10-CM | POA: Diagnosis present

## 2015-04-23 NOTE — Therapy (Signed)
[facility] [address] [place], [place], [address] Phone: [phone]   Fax:  [phone]  Physical Therapy Evaluation  Patient Details  Name: Kim Young MRN: [id number] Date of Birth: Dec 21, 1960 Referring Provider: Shaune Pollack, MD  Encounter Date: 04/23/2015      PT End of Session - 04/23/15 0929    Visit Number 1   Number of Visits 6   Date for PT Re-Evaluation 06/06/15   Authorization Type private insurance   PT Start Time 0845   PT Stop Time 0925   PT Time Calculation (min) 40 min   Activity Tolerance Patient tolerated treatment well   Behavior During Therapy [facility] for tasks assessed/performed      Past Medical History  Diagnosis Date  . Palpitations   . GERD (gastroesophageal reflux disease)   . Hernia     D. Jarold Motto  . Dry eye   . Endometriosis   . PAC (premature atrial contraction)   . Hyperlipidemia   . Hyperlipidemia   . Anxiety     Past Surgical History  Procedure Laterality Date  . Hemorrhoid surgery    . Tubal ligation    . Cesarean section  1998&2000    x2  . Laparoscopy      endometriosis    There were no vitals filed for this visit.  Visit Diagnosis:  BPPV (benign paroxysmal positional vertigo), bilateral  Abnormality of gait      Subjective Assessment - 04/23/15 0846    Subjective The patient reports onset of vertigo in early October upon waking up.  She reports a sensation of room spinning with lying down and getting up, as well as top of head pain with quick head movements.  She reports she is functioning in her day to day activities, but notes a pulling to one side with ambulation.   Patient Stated Goals Reduce dizziness.   Currently in Pain? No/denies  can get a sensation of headache coming on feeling a tightness at base of head            [facility] PT Assessment - 04/23/15 0849    Assessment   Medical Diagnosis vertigo   Referring Provider Shaune Pollack, MD   Onset Date/Surgical Date --  03/2015   Prior Therapy none   Precautions   Precautions None   Restrictions   Weight Bearing Restrictions No   Balance Screen   Has the patient fallen in the past 6 months No   Has the patient had a decrease in activity level because of a fear of falling?  No   Is the patient reluctant to leave their home because of a fear of falling?  No   Home Environment   Living Environment Private residence   Prior Function   Level of Independence Independent   Vocation Full time employment            Vestibular Assessment - 04/23/15 0850    Vestibular Assessment   General Observation Denies h/o vertigo, migraines, or hearing changes   Symptom Behavior   Type of Dizziness Spinning   Frequency of Dizziness daily   Duration of Dizziness seconds   Aggravating Factors Turning head quickly;Activity in general;Looking up to the ceiling;Mornings   Relieving Factors Head stationary   Occulomotor Exam   Occulomotor Alignment Normal   Spontaneous Absent   Gaze-induced Absent   Smooth Pursuits Intact   Vestibulo-Occular Reflex   VOR 1 Head Only (x 1 viewing) slow pace with gaze  fixation with mild senation of dizziness x 10 reps   Positional Testing   Dix-Hallpike Dix-Hallpike Right;Dix-Hallpike Left   Horizontal Canal Testing Horizontal Canal Right;Horizontal Canal Left   Dix-Hallpike Right   Dix-Hallpike Right Duration 75 seconds   Dix-Hallpike Right Symptoms Upbeat, right rotatory nystagmus   Dix-Hallpike Left   Dix-Hallpike Left Duration 15 seconds   Dix-Hallpike Left Symptoms Upbeat, left rotatory nystagmus   Horizontal Canal Right   Horizontal Canal Right Duration none   Horizontal Canal Right Symptoms Normal   Horizontal Canal Left   Horizontal Canal Left Duration none   Horizontal Canal Left Symptoms Normal             Vestibular Treatment/Exercise - 04/23/15 0927    Vestibular Treatment/Exercise   Vestibular Treatment Provided Canalith  Repositioning   Canalith Repositioning Epley Manuever Left;Epley Manuever Right    EPLEY MANUEVER RIGHT   Number of Reps  --  3 on left, attempted 1 on right   Overall Response Improved Symptoms   Response Details  Initially treated with L epley's (treating most severe side first), on re-test no nystagmus noted.  Attempted to treat the right side with Epley's and on 2nd position, noted L nystagmus returned, therefore stopped and re-treated the left side.  Recommended to the patient that we wait until next session to re-assess and treat the 2nd ear.               PT Education - 04/23/15 0929    Education provided Yes   Education Details nature of BPPV   Person(s) Educated Patient   Methods Explanation;Handout   Comprehension Verbalized understanding          PT Short Term Goals - 04/23/15 0930    PT SHORT TERM GOAL #1   Title The patient will have negative R and L dix hallpike testing indicating resolution of BPPV.   Baseline Target date 05/22/2015   Time 4   Period Weeks   PT SHORT TERM GOAL #2   Title The patient will report resolution of dizziness with looking up and getting into/out of bed.   Baseline Target date 05/22/2015   Time 4   Period Weeks   PT SHORT TERM GOAL #3   Title the patient will be indep with HEP for habituation and gaze.   Baseline Target date 05/22/2015   Time 4   Period Weeks           PT Long Term Goals - 04/23/15 0931    PT LONG TERM GOAL #1   Title The patient will verbalize independence of HEP for future mgmt of BPPV   Baseline Target date 06/06/2015   Time 6   Period Weeks   PT LONG TERM GOAL #2   Title The patient will improve DHI from 28% to < or equal to 15%.   Baseline Target date 06/06/2015   Time 6   Period Weeks               Plan - 04/23/15 [id number]    Clinical Impression Statement The patient is a 54 yo female presenting with multi-canal BPPV.  The patient tolerated treatment well.   Pt will benefit from skilled  therapeutic intervention in order to improve on the following deficits Abnormal gait;Dizziness   Rehab Potential Good   PT Frequency 1x / week   PT Duration 6 weeks   PT Treatment/Interventions ADLs/Self Care Home Management;Balance training;Neuromuscular re-education;Canalith Repostioning;Vestibular;Therapeutic exercise;Therapeutic activities;Functional mobility training;Patient/family education   PT  Next Visit Plan Reassess bilateral dix hallpike, treat based on presentation, instruct in brandt daroff habituation, gaze if indicated   Consulted and Agree with Plan of Care Patient         Problem List Patient Active Problem List   Diagnosis Date Noted  . PAC (premature atrial contraction)   . Hyperlipidemia   . External hemorrhoids with complication 11/10/2011    Thank you for the referral of this patient. Margretta Ditty, MPT   Saori Umholtz, PT 04/23/2015, 9:34 AM  [facility] [address] [place], [place], [address] Phone: [phone]   Fax:  [phone]  Name: Devona Holmes MRN: [id number] Date of Birth: Feb 28, 1961

## 2015-04-26 ENCOUNTER — Ambulatory Visit
Admission: RE | Admit: 2015-04-26 | Discharge: 2015-04-26 | Disposition: A | Discharge status: Home / Self Care | Payer: 59 | Source: Ambulatory Visit

## 2015-04-26 DIAGNOSIS — Z1231 Encounter for screening mammogram for malignant neoplasm of breast: Secondary | ICD-10-CM

## 2015-04-27 ENCOUNTER — Other Ambulatory Visit: Payer: Self-pay | Admitting: Family Medicine

## 2015-04-27 DIAGNOSIS — N63 Unspecified lump in unspecified breast: Secondary | ICD-10-CM

## 2015-04-30 ENCOUNTER — Ambulatory Visit: Payer: 59 | Admitting: Rehabilitative and Restorative Service Providers"

## 2015-04-30 DIAGNOSIS — H8113 Benign paroxysmal vertigo, bilateral: Secondary | ICD-10-CM

## 2015-04-30 DIAGNOSIS — R269 Unspecified abnormalities of gait and mobility: Secondary | ICD-10-CM

## 2015-04-30 NOTE — Therapy (Signed)
[facility] [address] [place], [place], [address] Phone: [phone]   Fax:  [phone]  Physical Therapy Treatment  Patient Details  Name: Kim Young MRN: [id number] Date of Birth: January 28, 1961 Referring Provider: Darcus Austin, MD  Encounter Date: 04/30/2015      PT End of Session - 04/30/15 1208    Visit Number 2   Number of Visits 6   Date for PT Re-Evaluation 06/06/15   Authorization Type private insurance   PT Start Time 1020   PT Stop Time 1045   PT Time Calculation (min) 25 min   Activity Tolerance Patient tolerated treatment well   Behavior During Therapy [facility] for tasks assessed/performed      Past Medical History  Diagnosis Date  . Palpitations   . GERD (gastroesophageal reflux disease)   . Hernia     D. Sharlett Iles  . Dry eye   . Endometriosis   . PAC (premature atrial contraction)   . Hyperlipidemia   . Hyperlipidemia   . Anxiety     Past Surgical History  Procedure Laterality Date  . Hemorrhoid surgery    . Tubal ligation    . Cesarean section  1998&2000    x2  . Laparoscopy      endometriosis    There were no vitals filed for this visit.  Visit Diagnosis:  BPPV (benign paroxysmal positional vertigo), bilateral  Abnormality of gait      Subjective Assessment - 04/30/15 1017    Subjective The patient reports symptoms improved significantly after evaluation.  She reports she can lay down on the bed without spinning sensation, but still pulled to both sides at times.  On Wednesday she developed a general eyes feel swimmy sensation.   Patient Stated Goals Reduce dizziness.   Currently in Pain? No/denies                Vestibular Assessment - 04/30/15 1018    Vestibulo-Occular Reflex   Comment Head thrust test positive bilaterally for corrective saccade, SVA vs DVA is a 6 line difference (line 9 to line 3)   Positional Testing   Dix-Hallpike Dix-Hallpike  Right;Dix-Hallpike Left   Horizontal Canal Testing Horizontal Canal Left;Horizontal Canal Right   Dix-Hallpike Right   Dix-Hallpike Right Duration none   Dix-Hallpike Right Symptoms No nystagmus   Dix-Hallpike Left   Dix-Hallpike Left Duration none   Dix-Hallpike Left Symptoms No nystagmus   Horizontal Canal Right   Horizontal Canal Right Duration none   Horizontal Canal Right Symptoms Normal   Horizontal Canal Left   Horizontal Canal Left Duration none   Horizontal Canal Left Symptoms Normal       Corner balance exercises consisting of: Standing on level and compliant surface with: Feet partial heel<>toe with head turns and eyes closed, Compliant with feet apart and head turns and then head stationery with eyes closed Tandem with eyes open  CGA  for safety.  HEP provided for corner balance activities  Gaze x 1 viewing adaptation exercises x 30 seconds provoking 5/10 dizziness with visual blurring noted with increased speed.  PT educated patient on goals of gaze x 1 adaptation and added activities for HEP.        PT Education - 04/30/15 1207    Education provided Yes   Education Details HEP: gaze x 1 viewing, partial heel/toe with head turns, tandem eyes open, foam feet apart eyes closed   Person(s) Educated Patient   Methods Explanation;Demonstration;Handout   Comprehension  Verbalized understanding;Returned demonstration          PT Short Term Goals - 04/30/15 1208    PT SHORT TERM GOAL #1   Title The patient will have negative R and L dix hallpike testing indicating resolution of BPPV.   Baseline Met on 04/30/2015   Time 4   Period Weeks   Status Achieved   PT SHORT TERM GOAL #2   Title The patient will report resolution of dizziness with looking up and getting into/out of bed.   Baseline Target date 05/22/2015   Time 4   Period Weeks   Status On-going   PT SHORT TERM GOAL #3   Title the patient will be indep with HEP for habituation and gaze.   Baseline  Target date 05/22/2015   Time 4   Period Weeks   Status On-going           PT Long Term Goals - 04/23/15 0931    PT LONG TERM GOAL #1   Title The patient will verbalize independence of HEP for future mgmt of BPPV   Baseline Target date 06/06/2015   Time 6   Period Weeks   PT LONG TERM GOAL #2   Title The patient will improve DHI from 28% to < or equal to 15%.   Baseline Target date 06/06/2015   Time 6   Period Weeks               Plan - 04/30/15 1208    Clinical Impression Statement At today's session, the patient was resolved for BPPv, however still has diminished gaze fixation with head turns (per abnormal SVA vs DVA and positive head thrust testing).  PT also addressed balance issues based on patient's complaints of being pulled to one side.   PT Next Visit Plan Check vertigo, check gaze x 1 and progress foot position + speed, check balance HEP   Consulted and Agree with Plan of Care Patient        Problem List Patient Active Problem List   Diagnosis Date Noted  . PAC (premature atrial contraction)   . Hyperlipidemia   . External hemorrhoids with complication 54/62/7035    Rakhi Romagnoli, PT 04/30/2015, 12:13 PM  [facility] [address] [place], [place], [address] Phone: [phone]   Fax:  [phone]  Name: Kim Young MRN: [id number] Date of Birth: May 15, 1961

## 2015-04-30 NOTE — Patient Instructions (Signed)
  Gaze Stabilization: Tip Card 1.Target must remain in focus, not blurry, and appear stationary while head is in motion. 2.Perform exercises with small head movements (45 to either side of midline). 3.Increase speed of head motion so long as target is in focus. 4.If you wear eyeglasses, be sure you can see target through lens (therapist will give specific instructions for bifocal / progressive lenses). 5.These exercises may provoke dizziness or nausea. Work through these symptoms. If too dizzy, slow head movement slightly. Rest between each exercise. 6.Exercises demand concentration; avoid distractions. 7.For safety, perform standing exercises close to a counter, wall, corner, or next to someone.  Copyright  VHI. All rights reserved.  Gaze Stabilization: Standing Feet Apart   Feet shoulder width apart, keeping eyes on target on wall 3 feet away, tilt head down slightly and move head side to side for 30 seconds. Repeat while moving head up and down for 30 seconds. Do 2 sessions per day.   Copyright  VHI. All rights reserved.   Feet Heel-Toe "Tandem", Varied Arm Positions - Eyes Open    With eyes open, right foot directly in front of the other, arms at your side, look straight ahead at a stationary object. Hold _30___ seconds. Repeat _3___ times per session with each foot forward. Do __2__ sessions per day.  Copyright  VHI. All rights reserved.   Feet Partial Heel-Toe, Head Motion - Eyes Open    With eyes open, right foot partially in front of the other, move head slowly: up and down.  Switch feet and repeat side to side.  *do 5 head turns. Repeat __2__ times per session. Do _2___ sessions per day.  Copyright  VHI. All rights reserved.  Feet Apart (Compliant Surface) Varied Arm Positions - Eyes Closed    Stand on compliant surface: ____a pillow ____ with feet shoulder width apart and arms at your side. Close eyes and visualize upright position. Hold_30___ seconds. Repeat  __3__ times per session. Do __2  sessions per day.  Copyright  VHI. All rights reserved.

## 2015-05-11 ENCOUNTER — Ambulatory Visit
Admission: RE | Admit: 2015-05-11 | Discharge: 2015-05-11 | Disposition: A | Discharge status: Home / Self Care | Payer: 59 | Source: Ambulatory Visit | Attending: Family Medicine | Admitting: Family Medicine

## 2015-05-11 DIAGNOSIS — N63 Unspecified lump in unspecified breast: Secondary | ICD-10-CM

## 2015-05-14 ENCOUNTER — Encounter: Payer: 59 | Admitting: Rehabilitative and Restorative Service Providers"

## 2015-05-21 ENCOUNTER — Ambulatory Visit: Payer: 59 | Admitting: Rehabilitative and Restorative Service Providers"

## 2015-05-27 NOTE — Progress Notes (Signed)
Cardiology Office Note   Date:  05/28/2015   ID:  Kim Young, DOB [birth year], MRN [id number]  PCP:  Hollice EspyGATES,DONNA RUTH, MD    Chief Complaint  Patient presents with  . Hyperlipidemia  . Medication Refill      History of Present Illness: Kim Young is a 54 y.o. female with a history of PAC's and dyslipidemia. She is doing well. She denies any chest pain, SOB, DOE, LE edema or syncope. She does not exercise.  She has had a cold and problems with vertigo from the head congestion.       Past Medical History  Diagnosis Date  . Palpitations   . GERD (gastroesophageal reflux disease)   . Hernia     D. Jarold MottoPatterson  . Dry eye   . Endometriosis   . PAC (premature atrial contraction)   . Hyperlipidemia   . Hyperlipidemia   . Anxiety     Past Surgical History  Procedure Laterality Date  . Hemorrhoid surgery    . Tubal ligation    . Cesarean section  1998&2000    x2  . Laparoscopy      endometriosis     Current Outpatient Prescriptions  Medication Sig Dispense Refill  . ezetimibe-simvastatin (VYTORIN) 10-40 MG per tablet Take 1 tablet by mouth daily. 90 tablet 0  . meloxicam (MOBIC) 15 MG tablet Take 15 mg by mouth daily.    . nebivolol (BYSTOLIC) 5 MG tablet Take 1 tablet (5 mg total) by mouth daily. 90 tablet 0  . Norethindrone Acetate-Ethinyl Estrad-FE (LOESTRIN 24 FE) 1-20 MG-MCG(24) tablet Take 1 tablet by mouth daily. 1 Package 10  . sertraline (ZOLOFT) 100 MG tablet Take 100 mg by mouth daily.     No current facility-administered medications for this visit.    Allergies:   Cymbalta and Penicillins    Social History:  The patient  reports that she has never smoked. She has never used smokeless tobacco. She reports that she does not drink alcohol or use illicit drugs.   Family History:  The patient's family history includes CVA (age of onset: 7475) in her mother; Cancer in her father, maternal grandmother, and sister; Diabetes in  her mother; Heart disease in her mother; Hyperlipidemia in her mother; Hypertension in her mother; Stroke in her mother.    ROS:  Please see the history of present illness.   Otherwise, review of systems are positive for none.   All other systems are reviewed and negative.    PHYSICAL EXAM: VS:  BP 100/60 mmHg  Pulse 59  Ht 5\' 7"  (1.702 m)  Wt 197 lb (89.359 kg)  BMI 30.85 kg/m2 , BMI Body mass index is 30.85 kg/(m^2). GEN: Well nourished, well developed, in no acute distress HEENT: normal Neck: no JVD, carotid bruits, or masses Cardiac: RRR; no murmurs, rubs, or gallops,no edema  Respiratory:  clear to auscultation bilaterally, normal work of breathing GI: soft, nontender, nondistended, + BS MS: no deformity or atrophy Skin: warm and dry, no rash Neuro:  Strength and sensation are intact Psych: euthymic mood, full affect   EKG:  EKG is ordered today. The ekg ordered today demonstrates sinus bradycardia at 59bpm with no ST changes   Recent Labs: 03/16/2015: ALT 30*; BUN 17; Creat 0.79; Hemoglobin 14.4; Potassium 4.8; Sodium 140    Lipid Panel    Component Value Date/Time   CHOL 146 09/29/2014 0740  CHOL 268* 05/06/2014 0739   TRIG 100.0 09/29/2014 0740   TRIG 185* 05/06/2014 0739   HDL 44.10 09/29/2014 0740   HDL 44 05/06/2014 0739   CHOLHDL 3 09/29/2014 0740   VLDL 20.0 09/29/2014 0740   LDLCALC 82 09/29/2014 0740   LDLCALC 187* 05/06/2014 0739      Wt Readings from Last 3 Encounters:  05/28/15 197 lb (89.359 kg)  03/16/15 196 lb (88.905 kg)  03/03/15 197 lb 9.6 oz (89.631 kg)    ASSESSMENT AND PLAN:  1. PAC's - asymptomatic - continue Bystolic  2. Dyslipidemia  - continue simvastatin - check fasting NMR and ALT  3. Heart murmur - 2D echo normal 04/2014    Current medicines are reviewed at length with the patient today.  The patient does not have concerns regarding medicines.  The following changes have been made:  no  change  Labs/ tests ordered today: See above Assessment and Plan No orders of the defined types were placed in this encounter.     Disposition:   FU with me in 1 year  Signed, Quintella Reichert, MD  05/28/2015 8:53 AM    [facility] [address] [place], [place], [place]  [address] Phone: [phone]; Fax: [phone]

## 2015-05-28 ENCOUNTER — Encounter: Payer: Self-pay | Admitting: Cardiology

## 2015-05-28 ENCOUNTER — Ambulatory Visit (INDEPENDENT_AMBULATORY_CARE_PROVIDER_SITE_OTHER): Payer: 59 | Admitting: Cardiology

## 2015-05-28 VITALS — BP 100/60 | HR 59 | Ht 67.0 in | Wt 197.0 lb

## 2015-05-28 DIAGNOSIS — E785 Hyperlipidemia, unspecified: Secondary | ICD-10-CM

## 2015-05-28 DIAGNOSIS — I491 Atrial premature depolarization: Secondary | ICD-10-CM

## 2015-05-28 LAB — HEMOGLOBIN A1C
Hgb A1c MFr Bld: 5.7 % — ABNORMAL HIGH (ref <5.7)
MEAN PLASMA GLUCOSE: 117 mg/dL — AB (ref <117)

## 2015-05-28 LAB — HEPATIC FUNCTION PANEL
ALBUMIN: 3.9 g/dL (ref 3.6–5.1)
ALT: 11 U/L (ref 6–29)
AST: 18 U/L (ref 10–35)
Alkaline Phosphatase: 72 U/L (ref 33–130)
BILIRUBIN TOTAL: 0.3 mg/dL (ref 0.2–1.2)
Total Protein: 7.2 g/dL (ref 6.1–8.1)

## 2015-05-28 MED ORDER — EZETIMIBE-SIMVASTATIN 10-40 MG PO TABS: 1.0000 | ORAL_TABLET | Freq: Every day | ORAL | Status: DC

## 2015-05-28 MED ORDER — NEBIVOLOL HCL 5 MG PO TABS: 5.0000 mg | ORAL_TABLET | Freq: Every day | ORAL | Status: DC

## 2015-05-28 NOTE — Patient Instructions (Signed)
Medication Instructions:  Your physician recommends that you continue on your current medications as directed. Please refer to the Current Medication list given to you today.   Labwork: Your physician recommends that you return for FASTING lab work.  Testing/Procedures: None  Follow-Up: Your physician wants you to follow-up in: 1 year with Dr. Turner. You will receive a reminder letter in the mail two months in advance. If you don't receive a letter, please call our office to schedule the follow-up appointment.   Any Other Special Instructions Will Be Listed Below (If Applicable).     If you need a refill on your cardiac medications before your next appointment, please call your pharmacy.   

## 2015-06-01 LAB — CARDIO IQ(R) ADVANCED LIPID PANEL
APOLIPOPROTEIN (CARDIO IQ ADV LIPID PANEL): 98 mg/dL (ref 49–103)
CHOLESTEROL, TOTAL (CARDIO IQ ADV LIPID PANEL): 158 mg/dL (ref 125–200)
CHOLESTEROL/HDL RATIO (CARDIO IQ ADV LIPID PANEL): 3.4 calc (ref <=5.0)
HDL CHOLESTEROL (CARDIO IQ ADV LIPID PANEL): 47 mg/dL (ref >=46)
LDL Large: 7880 nmol/L (ref 5038–17886)
LDL MEDIUM: 324 nmol/L (ref 121–397)
LDL PARTICLE NUMBER: 1658 nmol/L (ref 1016–2185)
LDL Peak Size: 212.7 Angstrom — ABNORMAL LOW (ref >=218.2)
LDL Small: 398 nmol/L — ABNORMAL HIGH (ref 115–386)
LDL, Calculated: 84 mg/dL
Lipoprotein (a): 15 nmol/L (ref <75)
NON-HDL CHOLESTEROL (CARDIO IQ ADV LIPID PANEL): 111 mg/dL
TRIGLYCERIDES (CARDIO IQ ADV LIPID PANEL): 137 mg/dL

## 2015-06-02 ENCOUNTER — Telehealth: Payer: Self-pay | Admitting: Cardiology

## 2015-06-02 NOTE — Telephone Encounter (Signed)
Informed patient that most lab work was normal, but some are still being reviewed. Patient understands she will be called with recommendations when labs are finalized.

## 2015-06-02 NOTE — Telephone Encounter (Signed)
New problem   Pt want to know results of labs she had done 12.16.16.

## 2015-06-08 ENCOUNTER — Telehealth: Payer: Self-pay

## 2015-06-08 DIAGNOSIS — E785 Hyperlipidemia, unspecified: Secondary | ICD-10-CM

## 2015-06-08 NOTE — Telephone Encounter (Signed)
-----   Message from Evie LacksSally R Earl, [facility] sent at 06/08/2015  3:46 PM EST ----- Pt being treated for primary prevention.  LDL-C, LDL-P, ApoB, Lp(a), and TG all at goal.  Continue current medications and recheck labs in 6 months.

## 2015-06-08 NOTE — Telephone Encounter (Signed)
Sent patient MyChart message that Lipids are at goal and to continue current medications. Informed patient that labs will be rechecked in 6 months and she will receive a letter to schedule those. Instructed patient to call back/send a MyChart message with questions or concerns.

## 2015-06-28 ENCOUNTER — Encounter: Payer: Self-pay | Admitting: Rehabilitative and Restorative Service Providers"

## 2015-06-28 NOTE — Therapy (Signed)
[facility] [address] [place], [place], [address] Phone: [phone]   Fax:  [phone]  Patient Details  Name: Kim Young MRN: [id number] Date of Birth: [birth year] Referring Provider:  No ref. provider found  Encounter Date: last encounter 04/30/2015  PHYSICAL THERAPY DISCHARGE SUMMARY  Visits from Start of Care: 2  Current functional level related to goals / functional outcomes:     PT Long Term Goals - 04/23/15 0931    PT LONG TERM GOAL #1   Title The patient will verbalize independence of HEP for future mgmt of BPPV   Baseline Target date 06/06/2015   Time 6   Period Weeks   PT LONG TERM GOAL #2   Title The patient will improve DHI from 28% to < or equal to 15%.   Baseline Target date 06/06/2015   Time 6   Period Weeks     Patient did not return for further therapy so goals not reassessed.   Remaining deficits: See initial eval dated 04/23/2015   Education / Equipment: HEP.  Plan: Patient agrees to discharge.  Patient goals were not met. Patient is being discharged due to not returning since the last visit.  ?????       Thank you for the referral of this patient. Rudell Cobb, MPT    Kim Young 06/28/2015, 10:56 AM  [facility] [address] [place], [place], [address] Phone: [phone]   Fax:  [phone]

## 2015-07-14 ENCOUNTER — Telehealth: Payer: Self-pay | Admitting: *Deleted

## 2015-07-14 NOTE — Telephone Encounter (Signed)
"  I'm calling to schedule surgery with Dr. Al Corpus.  I've seen him several times and he mentioned surgery."  When would you like to schedule.  His next available is March 3.  "I'd actually like to do it March 17."  That date is not available.  "No way possible at all?"  No, he is not doing surgery on that date.  "Okay so what's available, March 3, 10, 24 and 31?"  Yes, those days are available.  "This poses a problem for me.  Let me call you back to set up the date.  This puts a damper on my plans."

## 2015-07-18 ENCOUNTER — Other Ambulatory Visit: Payer: Self-pay | Admitting: Podiatry

## 2015-12-07 ENCOUNTER — Telehealth: Payer: Self-pay | Admitting: *Deleted

## 2015-12-07 NOTE — Telephone Encounter (Signed)
04 3 month recall R breast ultrasound/[facility]/tf   Please contact patient in regards to 04 recall. She is past due for her 3 month right breast U/S Thanks Consuella LoseElaine

## 2015-12-13 NOTE — Telephone Encounter (Signed)
Left message for patient to call back. -sco 

## 2015-12-15 ENCOUNTER — Encounter: Payer: Self-pay | Admitting: *Deleted

## 2015-12-15 NOTE — Telephone Encounter (Signed)
Patient left a message on the answering machine returning a call to Summer.

## 2015-12-15 NOTE — Telephone Encounter (Signed)
Letter placed on Dr. Rondel BatonMiller's desk for review. Removed from recall -eh

## 2015-12-15 NOTE — Telephone Encounter (Signed)
Dr. Hyacinth MeekerMiller please see message below and advise on recall status Thank you  Consuella LoseElaine

## 2015-12-15 NOTE — Telephone Encounter (Signed)
I think she still needs a letter.  If you will write it, I can review and sign it.  Then will remove from recall.  Thanks.

## 2015-12-15 NOTE — Telephone Encounter (Signed)
Returned call to patient. Left message to call back. -sco

## 2015-12-15 NOTE — Telephone Encounter (Signed)
Spoke to patient. She decided to not go back at the 3 month f/u because the spot had disappeared. Pt declines an appt to be made for her. Patient states will continue with yearly mammograms.

## 2015-12-27 ENCOUNTER — Encounter (HOSPITAL_COMMUNITY): Payer: Self-pay | Admitting: Emergency Medicine

## 2015-12-27 ENCOUNTER — Ambulatory Visit (HOSPITAL_COMMUNITY)
Admission: EM | Admit: 2015-12-27 | Discharge: 2015-12-27 | Disposition: A | Discharge status: Home / Self Care | Payer: 59 | Attending: Family Medicine | Admitting: Family Medicine

## 2015-12-27 DIAGNOSIS — J069 Acute upper respiratory infection, unspecified: Secondary | ICD-10-CM

## 2015-12-27 DIAGNOSIS — H6501 Acute serous otitis media, right ear: Secondary | ICD-10-CM

## 2015-12-27 MED ORDER — AZITHROMYCIN 250 MG PO TABS: 250.0000 mg | ORAL_TABLET | Freq: Every day | ORAL | Status: DC

## 2015-12-27 MED ORDER — FLUTICASONE PROPIONATE 50 MCG/ACT NA SUSP: 2.0000 | Freq: Every day | NASAL | Status: DC

## 2015-12-27 NOTE — Discharge Instructions (Signed)
You may take 400-600mg Ibuprofen (Motrin) every 6-8 hours for fever and pain  °Alternate with Tylenol  °You may take 500mg Tylenol every 4-6 hours as needed for fever and pain  °Follow-up with your primary care provider next week for recheck of symptoms if not improving.  °Be sure to drink plenty of fluids and rest, at least 8hrs of sleep a night, preferably more while you are sick. °Return urgent care or go to closest ER if you cannot keep down fluids/signs of dehydration, fever not reducing with Tylenol, difficulty breathing/wheezing, stiff neck, worsening condition, or other concerns (see below)  °Please take antibiotics as prescribed and be sure to complete entire course even if you start to feel better to ensure infection does not come back. ° ° °Cool Mist Vaporizers °Vaporizers may help relieve the symptoms of a cough and cold. They add moisture to the air, which helps mucus to become thinner and less sticky. This makes it easier to breathe and cough up secretions. Cool mist vaporizers do not cause serious burns like hot mist vaporizers, which may also be called steamers or humidifiers. Vaporizers have not been proven to help with colds. You should not use a vaporizer if you are allergic to mold. °HOME CARE INSTRUCTIONS °· Follow the package instructions for the vaporizer. °· Do not use anything other than distilled water in the vaporizer. °· Do not run the vaporizer all of the time. This can cause mold or bacteria to grow in the vaporizer. °· Clean the vaporizer after each time it is used. °· Clean and dry the vaporizer well before storing it. °· Stop using the vaporizer if worsening respiratory symptoms develop. °  °This information is not intended to replace advice given to you by your health care provider. Make sure you discuss any questions you have with your health care provider. °  °Document Released: 02/24/2004 Document Revised: 06/03/2013 Document Reviewed: 10/16/2012 °Elsevier Interactive Patient  Education ©2016 Elsevier Inc. ° °

## 2015-12-27 NOTE — ED Notes (Signed)
Pt c/o bilateral ear pain onset x4 days associated w/HA, sore neck Denies fevers, chills, cold sx Taking OTC sinus/allergy meds A&O x4... NAD

## 2015-12-27 NOTE — ED Provider Notes (Signed)
CSN: [id number]     Arrival date & time 12/27/15  1642 History   First MD Initiated Contact with Patient 12/27/15 1730     Chief Complaint  Patient presents with  . Otalgia   (Consider location/radiation/quality/duration/timing/severity/associated sxs/prior Treatment) HPI  Kim Young is a 55 y.o. female presenting to UC with c/o gradually worsening bilateral ear pain for about 4 days, pain worse in Right ear compared to Left. Pain is moderate in severity, associated nasal congestion, frontal headache, and mild dry cough. She has tried OTC sinus/allergy medication w/o relief. Denies n/v/d. Denies fever or chills. No sick contacts or recent travel.    Past Medical History  Diagnosis Date  . Palpitations   . GERD (gastroesophageal reflux disease)   . Hernia     D. Jarold MottoPatterson  . Dry eye   . Endometriosis   . PAC (premature atrial contraction)   . Hyperlipidemia   . Hyperlipidemia   . Anxiety    Past Surgical History  Procedure Laterality Date  . Hemorrhoid surgery    . Tubal ligation    . Cesarean section  1998&2000    x2  . Laparoscopy      endometriosis   Family History  Problem Relation Age of Onset  . Heart disease Mother   . Stroke Mother   . Diabetes Mother   . CVA Mother 4375    on dialysis  . Hypertension Mother   . Hyperlipidemia Mother   . Cancer Father     pancreatic  . Cancer Sister     Cervical  . Cancer Maternal Grandmother     Lung   Social History  Substance Use Topics  . Smoking status: Never Smoker   . Smokeless tobacco: Never Used  . Alcohol Use: No   OB History    Gravida Para Term Preterm AB TAB SAB Ectopic Multiple Living   2 2 2       2      Review of Systems  Constitutional: Negative for fever, chills and fatigue.  HENT: Positive for congestion, ear pain, rhinorrhea, sinus pressure and sore throat. Negative for ear discharge, trouble swallowing and voice change.   Respiratory: Positive for cough. Negative for shortness of breath.    Gastrointestinal: Negative for nausea, vomiting and diarrhea.  Neurological: Positive for headaches. Negative for dizziness and light-headedness.    Allergies  Cymbalta and Penicillins  Home Medications   Prior to Admission medications   Medication Sig Start Date End Date Taking? Authorizing Provider  ezetimibe-simvastatin (VYTORIN) 10-40 MG tablet Take 1 tablet by mouth daily. 05/28/15  Yes Quintella Reichertraci R Turner, MD  nebivolol (BYSTOLIC) 5 MG tablet Take 1 tablet (5 mg total) by mouth daily. 05/28/15  Yes Quintella Reichertraci R Turner, MD  Norethindrone Acetate-Ethinyl Estrad-FE (LOESTRIN 24 FE) 1-20 MG-MCG(24) tablet Take 1 tablet by mouth daily. 03/20/15  Yes Brook E Ardell IsaacsAmundson C Silva, MD  azithromycin (ZITHROMAX) 250 MG tablet Take 1 tablet (250 mg total) by mouth daily. Take first 2 tablets together, then 1 every day until finished. 12/27/15   Junius FinnerErin O'Malley, PA-C  fluticasone (FLONASE) 50 MCG/ACT nasal spray Place 2 sprays into both nostrils daily. 12/27/15   Junius FinnerErin O'Malley, PA-C  meloxicam (MOBIC) 15 MG tablet Take 15 mg by mouth daily. 05/09/15   Historical Provider, MD  meloxicam (MOBIC) 15 MG tablet TAKE ONE TABLET BY MOUTH ONCE DAILY 07/19/15   Max T Hyatt, DPM  sertraline (ZOLOFT) 100 MG tablet Take 100 mg by mouth daily.  Historical Provider, MD   Meds Ordered and Administered this Visit  Medications - No data to display  BP 111/58 mmHg  Pulse 60  Temp(Src) 98.9 F (37.2 C) (Oral)  Resp 12  SpO2 99% No data found.   Physical Exam  Constitutional: She appears well-developed and well-nourished. No distress.  HENT:  Head: Normocephalic and atraumatic.  Right Ear: Tympanic membrane is injected and bulging. Tympanic membrane is not erythematous. A middle ear effusion is present.  Left Ear: Tympanic membrane normal.  Nose: Mucosal edema present. Right sinus exhibits no maxillary sinus tenderness and no frontal sinus tenderness. Left sinus exhibits no maxillary sinus tenderness and no frontal sinus  tenderness.  Mouth/Throat: Uvula is midline and mucous membranes are normal. Posterior oropharyngeal erythema present. No oropharyngeal exudate, posterior oropharyngeal edema or tonsillar abscesses.  Eyes: Conjunctivae are normal. No scleral icterus.  Neck: Normal range of motion. Neck supple.  Cardiovascular: Normal rate, regular rhythm and normal heart sounds.   Pulmonary/Chest: Effort normal and breath sounds normal. No stridor. No respiratory distress. She has no wheezes. She has no rales.  Abdominal: Soft. She exhibits no distension. There is no tenderness.  Musculoskeletal: Normal range of motion.  Lymphadenopathy:    She has no cervical adenopathy.  Neurological: She is alert.  Skin: Skin is warm and dry. She is not diaphoretic.  Nursing note and vitals reviewed.   ED Course  Procedures (including critical care time)  Labs Review Labs Reviewed - No data to display  Imaging Review No results found.     MDM   1. Right acute serous otitis media, recurrence not specified   2. Upper respiratory infection     Exam c/w early onset Right AOM with bulging of TM, middle ear effusion and injected.  Pt has unknown allergy to PCN as she had rxn as a child.  Rx: azithromycin  Encouraged sinus rinses. Alternate acetaminophen and ibuprofen for pain. Patient verbalized understanding and agreement with treatment plan.    Junius Finner, PA-C 12/27/15 1754

## 2016-01-03 ENCOUNTER — Ambulatory Visit (HOSPITAL_COMMUNITY)
Admission: EM | Admit: 2016-01-03 | Discharge: 2016-01-03 | Disposition: A | Discharge status: Home / Self Care | Payer: 59 | Attending: Internal Medicine | Admitting: Internal Medicine

## 2016-01-03 DIAGNOSIS — J019 Acute sinusitis, unspecified: Secondary | ICD-10-CM

## 2016-01-03 DIAGNOSIS — H65111 Acute and subacute allergic otitis media (mucoid) (sanguinous) (serous), right ear: Secondary | ICD-10-CM | POA: Diagnosis not present

## 2016-01-03 MED ORDER — PREDNISONE 50 MG PO TABS: 50.0000 mg | ORAL_TABLET | Freq: Every day | ORAL | 0 refills | Status: DC

## 2016-01-03 MED ORDER — LEVOFLOXACIN 500 MG PO TABS: 500.0000 mg | ORAL_TABLET | Freq: Every day | ORAL | 0 refills | Status: DC

## 2016-01-03 NOTE — Discharge Instructions (Signed)
Take prednisone and levofloxacin.  Anticipate gradual improvement in congestion/ear ache over the next 2-4 weeks.  Recheck or followup with Dr Kevan Ny if not improving, or for increasing phlegm or new fever >100.5.  Continue the nasal steroid for the next 2-4 weeks.

## 2016-01-03 NOTE — ED Provider Notes (Signed)
[facility]    CSN: [id number] Arrival date & time: 01/03/16  1191  First Provider Contact:  None       History   Chief Complaint Chief complaint: Ear infection   HPI Kim Young is a 55 y.o. female.  She presents today with persistent right earache and congestion, some headache. Taking ibuprofen to manage symptoms. She was seen at the urgent care a week ago, and prescribed a Z-Pak and a nasal steroid. Minimal improvement in the earache, and headache since. Some improvement in runny/congested nose. No sore throat. No cough. No fever. No nausea/vomiting. No drainage from the ear. She does complain of cloudiness and decreased hearing to the right ear.  HPI  Past Medical History:  Diagnosis Date  . Anxiety   . Dry eye   . Endometriosis   . GERD (gastroesophageal reflux disease)   . Hernia    D. Jarold Motto  . Hyperlipidemia   . Hyperlipidemia   . PAC (premature atrial contraction)   . Palpitations     Patient Active Problem List   Diagnosis Date Noted  . PAC (premature atrial contraction)   . Hyperlipidemia   . External hemorrhoids with complication 11/10/2011    Past Surgical History:  Procedure Laterality Date  . CESAREAN SECTION  1998&2000   x2  . HEMORRHOID SURGERY    . LAPAROSCOPY     endometriosis  . TUBAL LIGATION      OB History    Gravida Para Term Preterm AB Living   SAB TAB Ectopic Multiple Live Births                   Home Medications    Prior to Admission medications   Medication Sig Start Date End Date Taking? Authorizing Provider  ezetimibe-simvastatin (VYTORIN) 10-40 MG tablet Take 1 tablet by mouth daily. 05/28/15   Quintella Reichert, MD  fluticasone (FLONASE) 50 MCG/ACT nasal spray Place 2 sprays into both nostrils daily. 12/27/15   Junius Finner, PA-C  levofloxacin (LEVAQUIN) 500 MG tablet Take 1 tablet (500 mg total) by mouth daily. 01/03/16   Eustace Moore, MD  meloxicam (MOBIC) 15 MG tablet Take 15 mg by  mouth daily. 05/09/15   Historical Provider, MD  meloxicam (MOBIC) 15 MG tablet TAKE ONE TABLET BY MOUTH ONCE DAILY 07/19/15   Max T Hyatt, DPM  nebivolol (BYSTOLIC) 5 MG tablet Take 1 tablet (5 mg total) by mouth daily. 05/28/15   Quintella Reichert, MD  Norethindrone Acetate-Ethinyl Estrad-FE (LOESTRIN 24 FE) 1-20 MG-MCG(24) tablet Take 1 tablet by mouth daily. 03/20/15   Brook Rosalin Hawking, MD  predniSONE (DELTASONE) 50 MG tablet Take 1 tablet (50 mg total) by mouth daily. 01/03/16   Eustace Moore, MD  sertraline (ZOLOFT) 100 MG tablet Take 100 mg by mouth daily.    Historical Provider, MD    Family History Family History  Problem Relation Age of Onset  . Heart disease Mother   . Stroke Mother   . Diabetes Mother   . CVA Mother 82    on dialysis  . Hypertension Mother   . Hyperlipidemia Mother   . Cancer Father     pancreatic  . Cancer Sister     Cervical  . Cancer Maternal Grandmother     Lung    Social History Social History  Substance Use Topics  . Smoking status: Never Smoker  . Smokeless tobacco:  Never Used  . Alcohol use No     Allergies   Cymbalta [duloxetine hcl] and Penicillins   Review of Systems Review of Systems  All other systems reviewed and are negative.    Physical Exam Triage Vital Signs ED Triage Vitals [01/03/16 1022]  Enc Vitals Group     BP 125/75     Pulse Rate 60     Resp 16     Temp 98.5 F (36.9 C)     Temp Source Oral     SpO2 96 %     Weight      Height    Updated Vital Signs BP 125/75 (BP Location: Left Arm)   Pulse 60   Temp 98.5 F (36.9 C) (Oral)   Resp 16   SpO2 96%  Physical Exam  Constitutional: She appears well-developed and well-nourished. No distress.  HENT:  Head: Atraumatic.  Bilateral TMs are quite dull, right TM is also red Marked nasal congestion Throat is pink with scant postnasal drainage  Eyes: Conjunctivae are normal.  No eye redness/drainage  Neck: Neck supple.  Cardiovascular: Normal rate and  regular rhythm.   No murmur heard. Pulmonary/Chest: Effort normal and breath sounds normal. No respiratory distress. She has no wheezes. She has no rales.  Abdominal: She exhibits no distension.  Musculoskeletal: She exhibits no edema.  Neurological: She is alert.  Skin: Skin is warm and dry. No rash noted.  Psychiatric: She has a normal mood and affect.  Nursing note and vitals reviewed.    UC Treatments / Results  Labs (all labs ordered are listed, but only abnormal results are displayed) Labs Reviewed - No data to display  EKG  EKG Interpretation None       Radiology No results found.  Procedures Procedures (including critical care time) none  Medications Ordered in UC Medications - No data to display   Final Clinical Impressions(s) / UC Diagnoses   Final diagnoses:  Acute sinusitis with symptoms > 10 days  Acute mucoid otitis media of right ear    New Prescriptions New Prescriptions   LEVOFLOXACIN (LEVAQUIN) 500 MG TABLET    Take 1 tablet (500 mg total) by mouth daily.   PREDNISONE (DELTASONE) 50 MG TABLET    Take 1 tablet (50 mg total) by mouth daily.     Eustace Moore, MD 01/13/16 [phone]

## 2016-01-11 DIAGNOSIS — H9203 Otalgia, bilateral: Secondary | ICD-10-CM | POA: Insufficient documentation

## 2016-01-11 DIAGNOSIS — M542 Cervicalgia: Secondary | ICD-10-CM | POA: Insufficient documentation

## 2016-01-11 DIAGNOSIS — M26629 Arthralgia of temporomandibular joint, unspecified side: Secondary | ICD-10-CM | POA: Insufficient documentation

## 2016-02-28 ENCOUNTER — Other Ambulatory Visit: Payer: Self-pay | Admitting: Obstetrics and Gynecology

## 2016-02-29 NOTE — Telephone Encounter (Signed)
Medication refill request: JUNEL 24 FE Last AEX:  03/03/15 BS Next AEX: 03/10/16 Last MMG (if hormonal medication request): 05/11/15 BIRADS 3 benign Refill authorized: 03/20/15 #1pack w/10 refills; today #1pack?

## 2016-03-10 ENCOUNTER — Ambulatory Visit (INDEPENDENT_AMBULATORY_CARE_PROVIDER_SITE_OTHER): Payer: 59 | Admitting: Obstetrics and Gynecology

## 2016-03-10 ENCOUNTER — Encounter: Payer: Self-pay | Admitting: Obstetrics and Gynecology

## 2016-03-10 VITALS — BP 100/60 | HR 64 | Resp 16 | Ht 67.5 in | Wt 205.8 lb

## 2016-03-10 DIAGNOSIS — Z113 Encounter for screening for infections with a predominantly sexual mode of transmission: Secondary | ICD-10-CM

## 2016-03-10 DIAGNOSIS — N63 Unspecified lump in breast: Secondary | ICD-10-CM | POA: Diagnosis not present

## 2016-03-10 DIAGNOSIS — N912 Amenorrhea, unspecified: Secondary | ICD-10-CM

## 2016-03-10 DIAGNOSIS — N6310 Unspecified lump in the right breast, unspecified quadrant: Secondary | ICD-10-CM

## 2016-03-10 DIAGNOSIS — Z01419 Encounter for gynecological examination (general) (routine) without abnormal findings: Secondary | ICD-10-CM

## 2016-03-10 LAB — POCT URINALYSIS DIPSTICK
Bilirubin, UA: NEGATIVE
GLUCOSE UA: NEGATIVE
Ketones, UA: NEGATIVE
LEUKOCYTES UA: NEGATIVE
NITRITE UA: NEGATIVE
Protein, UA: NEGATIVE
RBC UA: NEGATIVE
UROBILINOGEN UA: NEGATIVE
pH, UA: 5

## 2016-03-10 NOTE — Progress Notes (Signed)
55 y.o. [id number] Married Caucasian female here for annual exam.    No real menses on LoEstrin.  No hot flashes on the placebo pills.   Takes Bystolic for palpitations.   Wants to loose weight.   States her right breast lump was resolved.  She did not have the follow up ultrasound.   PCP: Shaune Pollackonna Gates, MD  Patient's last menstrual period was 02/25/2016.     Period Cycle (Days):  (not really having menses)     Sexually active: Yes.   female The current method of family planning is OCP (estrogen/progesterone)--Loestrin.    Exercising: No.   Smoker:  no  Health Maintenance: Pap:  02-19-13 Neg:Neg HR HPV History of abnormal Pap:  no MMG:  05-11-15 Diag.Bil.3D and Rt.Br.U/S d/t palpable abnormality--Density C/Probably benign fat necrosis versus complicated sebaceous cyst within the far lateral aspects of the right breast at the 8 o'clockaxis, 15 cm from the nipple, measuring 10 x 4 x 8 mm, corresponding to patient's palpable abnormality. Recommend follow-up right breast ultrasound in 3 months to ensure resolution/[facility] Colonoscopy:  2015 normal with Dr. Loreta AveMann. Next due 2025. BMD:   n/a  Result  n/a TDaP:  PCP Gardasil:   N/A HIV:  With pregnancy. Hep C:  Will do here in 3 weeks.  Screening Labs:  Hb today: Labs with Dr. Mayford Knifeurner, Urine today: Neg   reports that she has never smoked. She has never used smokeless tobacco. She reports that she does not drink alcohol or use drugs.  Past Medical History:  Diagnosis Date  . Anxiety   . Dry eye   . Endometriosis   . GERD (gastroesophageal reflux disease)   . Hernia    D. Jarold MottoPatterson  . Hyperlipidemia   . Hyperlipidemia   . PAC (premature atrial contraction)   . Palpitations     Past Surgical History:  Procedure Laterality Date  . CESAREAN SECTION  1998&2000   x2  . HEMORRHOID SURGERY    . LAPAROSCOPY     endometriosis  . TUBAL LIGATION      Current Outpatient Prescriptions  Medication Sig Dispense Refill   . ezetimibe-simvastatin (VYTORIN) 10-40 MG tablet Take 1 tablet by mouth daily. 90 tablet 3  . nebivolol (BYSTOLIC) 5 MG tablet Take 1 tablet by mouth daily.    . norethindrone-ethinyl estradiol (JUNEL FE,GILDESS FE,LOESTRIN FE) 1-20 MG-MCG tablet Take 1 tablet by mouth daily.    . sertraline (ZOLOFT) 100 MG tablet Take 100 mg by mouth daily.     No current facility-administered medications for this visit.     Family History  Problem Relation Age of Onset  . Heart disease Mother   . Stroke Mother   . Diabetes Mother   . CVA Mother 4075    on dialysis  . Hypertension Mother   . Hyperlipidemia Mother   . Cancer Father     pancreatic  . Cancer Sister     Cervical  . Cancer Maternal Grandmother     Lung    ROS:  Pertinent items are noted in HPI.  Otherwise, a comprehensive ROS was negative.  Exam:   BP 100/60 (BP Location: Right Arm, Patient Position: Sitting, Cuff Size: Normal)   Pulse 64   Resp 16   Ht 5' 7.5" (1.715 m)   Wt 205 lb 12.8 oz (93.4 kg)   LMP 02/25/2016 Comment: spotting only  BMI 31.76 kg/m     General appearance: alert, cooperative and appears stated age Head: Normocephalic, without  obvious abnormality, atraumatic Neck: no adenopathy, supple, symmetrical, trachea midline and thyroid normal to inspection and palpation Lungs: clear to auscultation bilaterally Breasts: normal appearance, no masses or tenderness, No nipple retraction or dimpling, No nipple discharge or bleeding, No axillary or supraclavicular adenopathy Heart: regular rate and rhythm Abdomen: soft, non-tender; no masses, no organomegaly Extremities: extremities normal, atraumatic, no cyanosis or edema Skin: Skin color, texture, turgor normal. No rashes or lesions Lymph nodes: Cervical, supraclavicular, and axillary nodes normal. No abnormal inguinal nodes palpated Neurologic: Grossly normal  Pelvic: External genitalia:  no lesions              Urethra:  normal appearing urethra with no  masses, tenderness or lesions              Bartholins and Skenes: normal                 Vagina: normal appearing vagina with normal color and discharge, no lesions              Cervix: no lesions              Pap taken: Yes.    Bleeds with pap.  Bimanual Exam:  Uterus:  normal size, contour, position, consistency, mobility, non-tender              Adnexa: no mass, fullness, tenderness              Rectal exam: Yes.  .  Confirms.              Anus:  normal sphincter tone, no lesions  Chaperone was present for exam.  Assessment:   Well woman visit with normal exam. Abnormal mammogram in November 2016.  Probable fat necrosis.  Overdue for follow up ultrasound recommended for Feb. 2017.  Perimenopausal female.   Plan: Yearly mammogram recommended after age 75.  Our office will call [facility] to help coordinate appropriate follow up for patient's diagnostic study last year.  I do not feel a mass on exam.  Recommended self breast exam.  Pap and HR HPV as above. Discussed Calcium, Vitamin D, regular exercise program including cardiovascular and weight bearing exercise. Stop combined oral contraceptives at end of this pack and then return for [facility] and estradiol.  Will also do TSH, vit D, and CBC then.  We discussed weight loss through diet and exercise.  Follow up annually and prn.      After visit summary provided.

## 2016-03-10 NOTE — Patient Instructions (Signed)

## 2016-03-10 NOTE — Progress Notes (Signed)
Scheduled patient while in office for right [facility] on 03/20/2016 at 3:10 pm.  Patient is agreeable to date and time. Patient placed in mammogram hold. Per the [facility] as long as this imaging returns normal she will be scheduled for her screening mammogram.

## 2016-03-14 LAB — IPS PAP TEST WITH HPV

## 2016-03-15 ENCOUNTER — Ambulatory Visit (INDEPENDENT_AMBULATORY_CARE_PROVIDER_SITE_OTHER): Payer: 59

## 2016-03-15 ENCOUNTER — Ambulatory Visit (INDEPENDENT_AMBULATORY_CARE_PROVIDER_SITE_OTHER): Payer: 59 | Admitting: Podiatry

## 2016-03-15 DIAGNOSIS — M205X2 Other deformities of toe(s) (acquired), left foot: Secondary | ICD-10-CM

## 2016-03-15 DIAGNOSIS — M722 Plantar fascial fibromatosis: Secondary | ICD-10-CM

## 2016-03-15 NOTE — Progress Notes (Signed)
She presents today with her husband for a surgical consult regarding the left foot. She states that has become considerably more painful and she is ready to have surgery. She had previously signed her consent form over a year ago for surgery. She had backed out at that time due to extenuating circumstances and is now ready for surgical intervention. She states that not only has the first metatarsophalangeal joint become more painful however the left heel is more painful as well. She denies changes in her past medical history medications allergies and surgeries.  Objective: Vital signs are stable she's alert and oriented 3. Pulses are palpable. Neurologic sensorium is intact per Semmes-Weinstein monofilament. Deep tendon reflexes are intact. Muscle strength is 5 over 5 dorsiflexion plantar flexors and inverters everters all intrinsic musculature is intact. She has limited range of motion of the first metatarsophalangeal joint of the left foot and she also has pain on palpation medially continue tubercle of the left foot. First metatarsophalangeal joint does demonstrate crepitation and radiographs taken today do demonstrate 3 views normal osseous architecture with exception of soft tissue increase in density at the plantar fascia insertion site joint space narrowing of the first metatarsophalangeal joint with dorsal spur and flattening of the base of the proximal phalanx consistent with hallux limitus. No other major osseous abnormalities. She does have an elongated second metatarsal with medial deviation of the second toe. Physical exam does not demonstrate pain on palpation of the second metatarsophalangeal joint and medial deviation of the toe with laxity and the lateral collateral ligament.  Assessment: Pain in limb secondary to hallux limitus with crepitation first metatarsophalangeal joint left foot. Plantar fasciitis left foot. Early pre-dislocation syndrome secondary to plantar flexed elongated second  metatarsal and lateral compensatory syndrome.  Plan: We discussed the etiology pathology conservative versus surgical therapies today at this point I discussed with her in great detail today the possibility of continued osteoarthritic changes of the first metatarsophalangeal joint E that after surgery she understands this and is amenable to it. We consented her today [facility] shortening and plantar flexor in nature with screw fixation. This should decompress the joint. An endoscopic plantar fasciotomy will also be performed as well as a shortening second metatarsal osteotomy and double screw fixation. She understands that it may be necessary to place a pin in the second toe. She understands that and is amenable to it if necessary. She is also amenable to a possible Keller arthroplasty with a single silicone implant if necessary and consents to have this done as well. We did discuss the possible postoperative complications which may include but are not limited to postop pain bleeding swelling infection recurrence need further surgery overcorrection under correction loss of digit loss of limb loss of life. We provided [facility] and I will follow-up with her in the future for surgical intervention. She will call with questions or concerns.

## 2016-03-15 NOTE — Patient Instructions (Signed)
Pre-Operative Instructions  Congratulations, you have decided to take an important step to improving your quality of life.  You can be assured that the doctors of [facility] will be with you every step of the way.  1. Plan to be at the surgery center/hospital at least 1 (one) hour prior to your scheduled time unless otherwise directed by the surgical center/hospital staff.  You must have a responsible adult accompany you, remain during the surgery and drive you home.  Make sure you have directions to the surgical center/hospital and know how to get there on time. 2. For hospital based surgery you will need to obtain a history and physical form from your family physician within 1 month prior to the date of surgery- we will give you a form for you primary physician.  3. We make every effort to accommodate the date you request for surgery.  There are however, times where surgery dates or times have to be moved.  We will contact you as soon as possible if a change in schedule is required.   4. No Aspirin/Ibuprofen for one week before surgery.  If you are on aspirin, any non-steroidal anti-inflammatory medications (Mobic, Aleve, Ibuprofen) you should stop taking it 7 days prior to your surgery.  You make take Tylenol  For pain prior to surgery.  5. Medications- If you are taking daily heart and blood pressure medications, seizure, reflux, allergy, asthma, anxiety, pain or diabetes medications, make sure the surgery center/hospital is aware before the day of surgery so they may notify you which medications to take or avoid the day of surgery. 6. No food or drink after midnight the night before surgery unless directed otherwise by surgical center/hospital staff. 7. No alcoholic beverages 24 hours prior to surgery.  No smoking 24 hours prior to or 24 hours after surgery. 8. Wear loose pants or shorts- loose enough to fit over bandages, boots, and casts. 9. No slip on shoes, sneakers are best. 10. Bring  your boot with you to the surgery center/hospital.  Also bring crutches or a walker if your physician has prescribed it for you.  If you do not have this equipment, it will be provided for you after surgery. 11. If you have not been contracted by the surgery center/hospital by the day before your surgery, call to confirm the date and time of your surgery. 12. Leave-time from work may vary depending on the type of surgery you have.  Appropriate arrangements should be made prior to surgery with your employer. 13. Prescriptions will be provided immediately following surgery by your doctor.  Have these filled as soon as possible after surgery and take the medication as directed. 14. Remove nail polish on the operative foot. 15. Wash the night before surgery.  The night before surgery wash the foot and leg well with the antibacterial soap provided and water paying special attention to beneath the toenails and in between the toes.  Rinse thoroughly with water and dry well with a towel.  Perform this wash unless told not to do so by your physician.  Enclosed: 1 Ice pack (please put in freezer the night before surgery)   1 Hibiclens skin cleaner   Pre-op Instructions  If you have any questions regarding the instructions, do not hesitate to call our office.  [place]: [address] [place], [place] [address] [phone]  [place]: [address] [place], [place] [address] [phone]  [place]: [address]  [place], [place] [address] [phone]   Dr.   Norman Regal DPM, Dr. Matthew Wagoner DPM, Dr. M. Todd Hyatt DPM, Dr. Titorya Stover DPM 

## 2016-03-20 ENCOUNTER — Ambulatory Visit
Admission: RE | Admit: 2016-03-20 | Discharge: 2016-03-20 | Disposition: A | Discharge status: Home / Self Care | Payer: 59 | Source: Ambulatory Visit | Attending: Obstetrics and Gynecology | Admitting: Obstetrics and Gynecology

## 2016-03-20 DIAGNOSIS — N6310 Unspecified lump in the right breast, unspecified quadrant: Secondary | ICD-10-CM

## 2016-03-24 ENCOUNTER — Telehealth: Payer: Self-pay | Admitting: *Deleted

## 2016-03-24 NOTE — Telephone Encounter (Signed)
"  I am a patient of Dr. Al CorpusHyatt.  I am just checking in.  I was not sure if I was to iniate the call or you.  I need to schedule surgery with Dr. Al CorpusHyatt.  Thank you.

## 2016-03-28 ENCOUNTER — Other Ambulatory Visit: Payer: Self-pay | Admitting: Unknown Physician Specialty

## 2016-03-28 DIAGNOSIS — R42 Dizziness and giddiness: Secondary | ICD-10-CM

## 2016-03-28 NOTE — Telephone Encounter (Signed)
"  I'm calling to get an appointment scheduled for Dr. Al CorpusHyatt to do a procedure."

## 2016-03-29 ENCOUNTER — Ambulatory Visit
Admission: RE | Admit: 2016-03-29 | Discharge: 2016-03-29 | Disposition: A | Discharge status: Home / Self Care | Payer: 59 | Source: Ambulatory Visit | Attending: Unknown Physician Specialty | Admitting: Unknown Physician Specialty

## 2016-03-29 DIAGNOSIS — R42 Dizziness and giddiness: Secondary | ICD-10-CM | POA: Diagnosis present

## 2016-03-29 MED ORDER — GADOBENATE DIMEGLUMINE 529 MG/ML IV SOLN
20.0000 mL | Freq: Once | INTRAVENOUS | Status: AC | PRN
  Administered 2016-03-29: 19 mL via INTRAVENOUS

## 2016-04-03 ENCOUNTER — Other Ambulatory Visit (INDEPENDENT_AMBULATORY_CARE_PROVIDER_SITE_OTHER): Payer: 59

## 2016-04-03 DIAGNOSIS — Z113 Encounter for screening for infections with a predominantly sexual mode of transmission: Secondary | ICD-10-CM

## 2016-04-03 DIAGNOSIS — Z01419 Encounter for gynecological examination (general) (routine) without abnormal findings: Secondary | ICD-10-CM

## 2016-04-03 DIAGNOSIS — E559 Vitamin D deficiency, unspecified: Secondary | ICD-10-CM

## 2016-04-03 DIAGNOSIS — Z0000 Encounter for general adult medical examination without abnormal findings: Secondary | ICD-10-CM

## 2016-04-03 DIAGNOSIS — N912 Amenorrhea, unspecified: Secondary | ICD-10-CM

## 2016-04-03 LAB — TSH: TSH: 2.34 m[IU]/L

## 2016-04-03 LAB — CBC
HEMATOCRIT: 41.9 % (ref 35.0–45.0)
HEMOGLOBIN: 13.9 g/dL (ref 11.7–15.5)
MCH: 30.7 pg (ref 27.0–33.0)
MCHC: 33.2 g/dL (ref 32.0–36.0)
MCV: 92.5 fL (ref 80.0–100.0)
MPV: 9.4 fL (ref 7.5–12.5)
Platelets: 271 10*3/uL (ref 140–400)
RBC: 4.53 MIL/uL (ref 3.80–5.10)
RDW: 13.6 % (ref 11.0–15.0)
WBC: 7.8 10*3/uL (ref 3.8–10.8)

## 2016-04-03 LAB — HEPATITIS C ANTIBODY: HCV AB: NEGATIVE

## 2016-04-04 LAB — VITAMIN D 25 HYDROXY (VIT D DEFICIENCY, FRACTURES): VIT D 25 HYDROXY: 19 ng/mL — AB (ref 30–100)

## 2016-04-04 LAB — ESTRADIOL

## 2016-04-04 LAB — FOLLICLE STIMULATING HORMONE: FSH: 23.9 m[IU]/mL

## 2016-04-04 MED ORDER — VITAMIN D (ERGOCALCIFEROL) 1.25 MG (50000 UNIT) PO CAPS: 50000.0000 [IU] | ORAL_CAPSULE | ORAL | 0 refills | Status: DC

## 2016-04-04 NOTE — Telephone Encounter (Signed)
I am returning your call.  I apologize for not calling you back.  We are short staff here so I have been assisting the doctors as well as had some days off.  You want to schedule surgery?  "Yes, I want to go ahead and get that scheduled."  Dr. Al CorpusHyatt doesn't have anything available until December.  Do you have a date in mind?  "I would like to do it December 8 or the 15."  He can do it on December 15.  He does not have anything available for December 8.  You will need to register with the surgical center, instructions are in your brochure that is located in your blue bag that was given to you.  Someone from the surgical center will call you with the arrival time a day or two prior to surgery date.

## 2016-04-10 ENCOUNTER — Other Ambulatory Visit: Payer: 59

## 2016-05-08 ENCOUNTER — Other Ambulatory Visit: Payer: Self-pay | Admitting: Cardiology

## 2016-05-09 ENCOUNTER — Other Ambulatory Visit: Payer: Self-pay

## 2016-05-09 NOTE — Telephone Encounter (Signed)
At last office visit 05/28/2015   Ordered Medications    Disp Refills Start End  ezetimibe-simvastatin (VYTORIN) 10-40 MG tablet 90 tablet 3 05/28/2015   Take 1 tablet by mouth daily. - Oral  nebivolol (BYSTOLIC) 5 MG tablet (Discontinued) 90 tablet 3 05/28/2015 03/10/2016  Take 1 tablet (5 mg total) by mouth daily. - Oral  Reason for Discontinue: Completed Course

## 2016-05-12 HISTORY — PX: FOOT SURGERY: SHX648

## 2016-05-15 DIAGNOSIS — M722 Plantar fascial fibromatosis: Secondary | ICD-10-CM

## 2016-05-23 ENCOUNTER — Other Ambulatory Visit: Payer: Self-pay | Admitting: Podiatry

## 2016-05-23 MED ORDER — HYDROMORPHONE HCL 4 MG PO TABS: 4.0000 mg | ORAL_TABLET | ORAL | 0 refills | Status: DC | PRN

## 2016-05-23 MED ORDER — CLINDAMYCIN HCL 150 MG PO CAPS: 150.0000 mg | ORAL_CAPSULE | Freq: Three times a day (TID) | ORAL | 0 refills | Status: DC

## 2016-05-23 MED ORDER — ONDANSETRON HCL 4 MG PO TABS: 4.0000 mg | ORAL_TABLET | Freq: Three times a day (TID) | ORAL | 0 refills | Status: DC | PRN

## 2016-05-26 ENCOUNTER — Encounter: Payer: Self-pay | Admitting: Podiatry

## 2016-05-26 DIAGNOSIS — M2042 Other hammer toe(s) (acquired), left foot: Secondary | ICD-10-CM | POA: Diagnosis not present

## 2016-05-26 DIAGNOSIS — M21542 Acquired clubfoot, left foot: Secondary | ICD-10-CM | POA: Diagnosis not present

## 2016-05-26 DIAGNOSIS — M722 Plantar fascial fibromatosis: Secondary | ICD-10-CM | POA: Diagnosis not present

## 2016-05-26 DIAGNOSIS — M2012 Hallux valgus (acquired), left foot: Secondary | ICD-10-CM | POA: Diagnosis not present

## 2016-05-30 ENCOUNTER — Telehealth: Payer: Self-pay

## 2016-05-30 NOTE — Telephone Encounter (Signed)
LVM regarding pt post op status 

## 2016-05-31 ENCOUNTER — Ambulatory Visit (INDEPENDENT_AMBULATORY_CARE_PROVIDER_SITE_OTHER): Payer: 59

## 2016-05-31 ENCOUNTER — Ambulatory Visit (INDEPENDENT_AMBULATORY_CARE_PROVIDER_SITE_OTHER): Payer: Self-pay | Admitting: Podiatry

## 2016-05-31 ENCOUNTER — Encounter: Payer: Self-pay | Admitting: Podiatry

## 2016-05-31 VITALS — BP 110/64 | HR 68 | Temp 97.8°F | Resp 16

## 2016-05-31 DIAGNOSIS — M722 Plantar fascial fibromatosis: Secondary | ICD-10-CM

## 2016-05-31 DIAGNOSIS — M205X2 Other deformities of toe(s) (acquired), left foot: Secondary | ICD-10-CM

## 2016-05-31 DIAGNOSIS — M2042 Other hammer toe(s) (acquired), left foot: Secondary | ICD-10-CM

## 2016-05-31 DIAGNOSIS — Z9889 Other specified postprocedural states: Secondary | ICD-10-CM

## 2016-05-31 NOTE — Progress Notes (Signed)
DOS 12.15.2017 Endoscopic Plantar Fasciotomy Left; [place] Osteotomy with Screw Left (possible implant); 2nd Metatarsal Osteotomy with Screw Left; (possible pinning of toe 2nd left.)

## 2016-06-01 NOTE — Progress Notes (Signed)
She presents today for her first postop visit she is status post Shelia Media osteotomy second metatarsal osteotomy with hammertoe repair. She reports that she's had a lot of pain she denies fever chills nausea vomiting.  Objective: Vital signs are stable alert and oriented 3 dress her dressing intact with no bleeding. Was removed demonstrates mild edema mild erythema surrounding the incision sites which appears to be more of an inflammatory event rather than cellulitis. Radiograph demonstrates O osteotomies first and second metatarsals are in good position with screws intact.  Assessment: Well healing surgical foot 1 week status post first and second met osteotomies.  Plan: [place] today dressed with compressive dressing encouraged elevation continue non-or limited weightbearing and encourage range of motion exercises. Follow up with her in 1-2 weeks.

## 2016-06-07 ENCOUNTER — Ambulatory Visit (INDEPENDENT_AMBULATORY_CARE_PROVIDER_SITE_OTHER): Payer: 59 | Admitting: Podiatry

## 2016-06-07 ENCOUNTER — Encounter: Payer: Self-pay | Admitting: Podiatry

## 2016-06-07 DIAGNOSIS — M722 Plantar fascial fibromatosis: Secondary | ICD-10-CM

## 2016-06-07 DIAGNOSIS — Z9889 Other specified postprocedural states: Secondary | ICD-10-CM

## 2016-06-07 DIAGNOSIS — M205X2 Other deformities of toe(s) (acquired), left foot: Secondary | ICD-10-CM

## 2016-06-07 NOTE — Progress Notes (Signed)
She presents today for follow-up of her bunion repair left foot. She states that she is doing quite well. She denies fever chills nausea vomiting muscle aches and pains. Status post endoscopic plantar fasciotomy second metatarsal osteotomy flexor tenotomy of the toe and a bunion repair.  Objective: Vital signs are stable she alert and 3 on contracture of the second metatarsophalangeal joint. Sutures are intact margins well coapted went ahead and removed all of her sutures today. Placed her in a compression anklet and a Darco digital splint as well as a Darco shoe. She will continue to wear her Cam Walker or her night splint at nighttime.  Assessment: Well-healing surgical foot.  Plan: Follow up with us in 2 weeks. Encouraged range of motion activities.

## 2016-06-09 ENCOUNTER — Other Ambulatory Visit: Payer: Self-pay | Admitting: Obstetrics and Gynecology

## 2016-06-09 ENCOUNTER — Telehealth: Payer: Self-pay | Admitting: Cardiology

## 2016-06-09 DIAGNOSIS — Z1231 Encounter for screening mammogram for malignant neoplasm of breast: Secondary | ICD-10-CM

## 2016-06-09 DIAGNOSIS — E785 Hyperlipidemia, unspecified: Secondary | ICD-10-CM

## 2016-06-09 NOTE — Telephone Encounter (Signed)
Kim Young is calling because she needs to have an order for her lab work prior to her appt with Dr. Mayford Knifeurner on 07-13-16 @2 :45pm. Please call. Thanks.

## 2016-06-11 NOTE — Telephone Encounter (Signed)
Fasting lipids with NMR panel and ALT

## 2016-06-13 NOTE — Telephone Encounter (Signed)
Scheduled patient 1/16 for fasting lab work. Patient agrees with treatment plan.

## 2016-06-19 ENCOUNTER — Other Ambulatory Visit: Payer: Self-pay

## 2016-06-19 DIAGNOSIS — R42 Dizziness and giddiness: Secondary | ICD-10-CM

## 2016-06-20 ENCOUNTER — Telehealth: Payer: Self-pay | Admitting: *Deleted

## 2016-06-20 NOTE — Telephone Encounter (Addendum)
Pt asked if Dr. Al CorpusHyatt would allow her to go back to work this week, she has seated duties at work. 06/21/2016-Informed pt of Dr. Geryl RankinsHyatt's orders and pt states she needs a form completed to go back to work. Pt is a [place] pt and I directed her to take the paperwork there and it would probably take 24 hours. Pt states understanding.

## 2016-06-20 NOTE — Telephone Encounter (Signed)
Ok but elevate as much as possible.

## 2016-06-21 ENCOUNTER — Other Ambulatory Visit (INDEPENDENT_AMBULATORY_CARE_PROVIDER_SITE_OTHER): Payer: 59

## 2016-06-21 DIAGNOSIS — E559 Vitamin D deficiency, unspecified: Secondary | ICD-10-CM

## 2016-06-22 LAB — VITAMIN D 25 HYDROXY (VIT D DEFICIENCY, FRACTURES): VIT D 25 HYDROXY: 36 ng/mL (ref 30–100)

## 2016-06-26 ENCOUNTER — Other Ambulatory Visit: Payer: Self-pay | Admitting: *Deleted

## 2016-06-26 ENCOUNTER — Telehealth: Payer: Self-pay | Admitting: Cardiology

## 2016-06-26 MED ORDER — NEBIVOLOL HCL 5 MG PO TABS: 5.0000 mg | ORAL_TABLET | Freq: Every day | ORAL | 0 refills | Status: DC

## 2016-06-26 MED ORDER — EZETIMIBE-SIMVASTATIN 10-40 MG PO TABS: 1.0000 | ORAL_TABLET | Freq: Every day | ORAL | 0 refills | Status: DC

## 2016-06-26 NOTE — Telephone Encounter (Signed)
New message   *STAT* If patient is at the pharmacy, call can be transferred to refill team.   1. Which medications need to be refilled? (please list name of each medication and dose if known) Bystolic 5mg .... Simvastatin 10-40mg    2. Which pharmacy/location (including street and city if local pharmacy) is medication to be sent to? [facility] [address]  3. Do they need a 30 day or 90 day supply? 90

## 2016-06-27 ENCOUNTER — Ambulatory Visit: Payer: 59

## 2016-06-27 ENCOUNTER — Other Ambulatory Visit: Payer: 59 | Admitting: *Deleted

## 2016-06-27 DIAGNOSIS — R42 Dizziness and giddiness: Secondary | ICD-10-CM | POA: Diagnosis not present

## 2016-06-27 DIAGNOSIS — E785 Hyperlipidemia, unspecified: Secondary | ICD-10-CM | POA: Diagnosis not present

## 2016-06-28 ENCOUNTER — Encounter: Payer: 59 | Admitting: Podiatry

## 2016-06-28 LAB — NMR, LIPOPROFILE
CHOLESTEROL: 229 mg/dL — AB (ref 100–199)
HDL CHOLESTEROL BY NMR: 49 mg/dL (ref >39)
HDL Particle Number: 29.4 umol/L — ABNORMAL LOW (ref >=30.5)
LDL Particle Number: 1888 nmol/L — ABNORMAL HIGH (ref <1000)
LDL Size: 21.4 nm (ref >20.5)
LDL-C: 155 mg/dL — ABNORMAL HIGH (ref 0–99)
LP-IR Score: 52 — ABNORMAL HIGH (ref <=45)
SMALL LDL PARTICLE NUMBER: 706 nmol/L — AB (ref <=527)
TRIGLYCERIDES BY NMR: 124 mg/dL (ref 0–149)

## 2016-06-28 LAB — VITAMIN B12: VITAMIN B 12: 404 pg/mL (ref 232–1245)

## 2016-06-28 LAB — APOLIPOPROTEIN A-1: Apolipoprotein A-1: 136 mg/dL (ref 116–209)

## 2016-06-28 LAB — APOLIPOPROTEIN B: APOLIPOPROTEIN B: 128 mg/dL (ref 54–133)

## 2016-06-28 LAB — ALT: ALT: 16 IU/L (ref 0–32)

## 2016-06-29 ENCOUNTER — Ambulatory Visit: Payer: 59

## 2016-06-30 ENCOUNTER — Telehealth: Payer: Self-pay | Admitting: *Deleted

## 2016-06-30 NOTE — Telephone Encounter (Addendum)
Pt states she would like to know how to go about getting to go back to work before her FMLA date of 07/27/2016. 07/03/2016-Informed pt of Dr. Geryl RankinsHyatt's statement and that she needed to follow up with him. Pt states has an appt today and I told her she could get her note at that time.

## 2016-07-03 ENCOUNTER — Encounter: Payer: Self-pay | Admitting: Podiatry

## 2016-07-03 ENCOUNTER — Ambulatory Visit (INDEPENDENT_AMBULATORY_CARE_PROVIDER_SITE_OTHER): Payer: Self-pay | Admitting: Podiatry

## 2016-07-03 ENCOUNTER — Encounter: Payer: Self-pay | Admitting: Cardiology

## 2016-07-03 ENCOUNTER — Ambulatory Visit (INDEPENDENT_AMBULATORY_CARE_PROVIDER_SITE_OTHER): Payer: 59

## 2016-07-03 DIAGNOSIS — M2042 Other hammer toe(s) (acquired), left foot: Secondary | ICD-10-CM

## 2016-07-03 DIAGNOSIS — M722 Plantar fascial fibromatosis: Secondary | ICD-10-CM

## 2016-07-03 DIAGNOSIS — M205X2 Other deformities of toe(s) (acquired), left foot: Secondary | ICD-10-CM | POA: Diagnosis not present

## 2016-07-03 DIAGNOSIS — Z9889 Other specified postprocedural states: Secondary | ICD-10-CM | POA: Diagnosis not present

## 2016-07-03 NOTE — Telephone Encounter (Signed)
Ok to go back to seated duties.  She needs to follow up with us.  Have not seen her in a month.

## 2016-07-03 NOTE — Telephone Encounter (Signed)
This encounter was created in error - please disregard.

## 2016-07-03 NOTE — Telephone Encounter (Signed)
New Message  Pt voiced returning nurses call from 1.19.17.

## 2016-07-04 NOTE — Progress Notes (Addendum)
She presents today for a follow-up of her osteotomy bunion repair and second metatarsal osteotomy date of surgery 05/26/2016. She also had an EPF. She states that she is doing better every day.  Objective: Vital signs are stable alert and oriented 3. Pulses are palpable. His limited range of motion of the first metatarsophalangeal joint with some contracture of the second toe at the tarsal phalangeal joint. At this point I feel that physical therapy is probably going to be necessary for her. Radiographs taken today confirming well-positioned osteotomy with consolidation of the bone.  Assessment: Well-healing surgical foot  Plan: Refer to physical therapy. Follow-up with her was then completed therapy.

## 2016-07-06 ENCOUNTER — Ambulatory Visit: Payer: 59 | Admitting: Podiatry

## 2016-07-11 DIAGNOSIS — M25572 Pain in left ankle and joints of left foot: Secondary | ICD-10-CM | POA: Diagnosis not present

## 2016-07-13 ENCOUNTER — Encounter: Payer: Self-pay | Admitting: Cardiology

## 2016-07-13 ENCOUNTER — Ambulatory Visit (INDEPENDENT_AMBULATORY_CARE_PROVIDER_SITE_OTHER): Payer: 59 | Admitting: Cardiology

## 2016-07-13 VITALS — BP 116/68 | HR 55 | Ht 67.5 in | Wt 202.0 lb

## 2016-07-13 DIAGNOSIS — I491 Atrial premature depolarization: Secondary | ICD-10-CM

## 2016-07-13 DIAGNOSIS — E7800 Pure hypercholesterolemia, unspecified: Secondary | ICD-10-CM

## 2016-07-13 MED ORDER — ROSUVASTATIN CALCIUM 20 MG PO TABS: 20.0000 mg | ORAL_TABLET | Freq: Every day | ORAL | 11 refills | Status: DC

## 2016-07-13 NOTE — Progress Notes (Signed)
Cardiology Office Note    Date:  07/13/2016   ID:  Kim Young, DOB 11-22-1960, MRN [id number]  PCP:  Hollice Espy, MD  Cardiologist:  Armanda Magic, MD   Chief Complaint  Patient presents with  . New Evaluation    pacs and dyslipidemia    History of Present Illness:  Kim Young is a 56 y.o. female  with a history of PAC's and dyslipidemia. She is doing well. She denies any chest pain, SOB, DOE, LE edema or syncope. She has some problems with vertigo and is seeing Neuro.  She has not been exercising due to foot surgery.  Past Medical History:  Diagnosis Date  . Anxiety   . Dry eye   . Endometriosis   . GERD (gastroesophageal reflux disease)   . Hernia    Kim Young  . Hyperlipidemia   . PAC (premature atrial contraction)     Past Surgical History:  Procedure Laterality Date  . CESAREAN SECTION  1998&2000   x2  . HEMORRHOID SURGERY    . LAPAROSCOPY     endometriosis  . TUBAL LIGATION      Current Medications: Outpatient Medications Prior to Visit  Medication Sig Dispense Refill  . ezetimibe-simvastatin (VYTORIN) 10-40 MG tablet Take 1 tablet by mouth daily. *Please keep 07/13/16 appointment for further refills* 30 tablet 0  . nebivolol (BYSTOLIC) 5 MG tablet Take 1 tablet (5 mg total) by mouth daily. *Please keep 07/13/16 appointment for further refills* 30 tablet 0  . sertraline (ZOLOFT) 100 MG tablet Take 100 mg by mouth daily.    Marland Kitchen HYDROmorphone (DILAUDID) 4 MG tablet Take 1 tablet (4 mg total) by mouth every 4 (four) hours as needed for severe pain. (Patient not taking: Reported on 07/13/2016) 40 tablet 0  . norethindrone-ethinyl estradiol (JUNEL FE,GILDESS FE,LOESTRIN FE) 1-20 MG-MCG tablet Take 1 tablet by mouth daily.    . ondansetron (ZOFRAN) 4 MG tablet Take 1 tablet (4 mg total) by mouth every 8 (eight) hours as needed for nausea or vomiting. (Patient not taking: Reported on 07/13/2016) 20 tablet 0  . Vitamin D, Ergocalciferol, (DRISDOL) 50000  units CAPS capsule Take 1 capsule (50,000 Units total) by mouth every 7 (seven) days. (Patient not taking: Reported on 07/13/2016) 12 capsule 0   No facility-administered medications prior to visit.      Allergies:   Cymbalta [duloxetine hcl]; Penicillin g; and Penicillins   Social History   Social History  . Marital status: Married    Spouse name: N/A  . Number of children: N/A  . Years of education: N/A   Social History Main Topics  . Smoking status: Never Smoker  . Smokeless tobacco: Never Used  . Alcohol use No  . Drug use: No  . Sexual activity: Yes    Partners: Male    Birth control/ protection: Pill     Comment: Lo Estrin   Other Topics Concern  . None   Social History Narrative  . None     Family History:  The patient's family history includes CVA (age of onset: 57) in her mother; Cancer in her father, maternal grandmother, and sister; Diabetes in her mother; Heart disease in her mother; Hyperlipidemia in her mother; Hypertension in her mother; Stroke in her mother.   ROS:   Please see the history of present illness.    ROS All other systems reviewed and are negative.  No flowsheet data found.     PHYSICAL EXAM:   VS:  BP  116/68   Pulse (!) 55   Ht 5' 7.5" (1.715 m)   Wt 202 lb (91.6 kg)   BMI 31.17 kg/m    GEN: Well nourished, well developed, in no acute distress  HEENT: normal  Neck: no JVD, carotid bruits, or masses Cardiac: RRR; no murmurs, rubs, or gallops,no edema.  Intact distal pulses bilaterally.  Respiratory:  clear to auscultation bilaterally, normal work of breathing GI: soft, nontender, nondistended, + BS MS: no deformity or atrophy  Skin: warm and dry, no rash Neuro:  Alert and Oriented x 3, Strength and sensation are intact Psych: euthymic mood, full affect  Wt Readings from Last 3 Encounters:  07/13/16 202 lb (91.6 kg)  03/10/16 205 lb 12.8 oz (93.4 kg)  05/28/15 197 lb (89.4 kg)      Studies/Labs Reviewed:   EKG:  EKG is  ordered today.  The ekg ordered today demonstrates none  Recent Labs: 04/03/2016: Hemoglobin 13.9; Platelets 271; TSH 2.34 06/27/2016: ALT 16   Lipid Panel    Component Value Date/Time   CHOL 229 (H) 06/27/2016 0809   CHOL 158 05/28/2015 0917   CHOL 268 (H) 05/06/2014 0739   TRIG 124 06/27/2016 0809   TRIG 185 (H) 05/06/2014 0739   HDL 49 06/27/2016 0809   HDL 44 05/06/2014 0739   CHOLHDL 3.4 05/28/2015 0917   CHOLHDL 3 09/29/2014 0740   VLDL 20.0 09/29/2014 0740   LDLCALC 84 05/28/2015 0917   LDLCALC 187 (H) 05/06/2014 0739    Additional studies/ records that were reviewed today include:  none    ASSESSMENT:    1. PAC (premature atrial contraction)   2. Pure hypercholesterolemia      PLAN:  In order of problems listed above:  1. PAC's - suppressed on Bystolic.  She will continue on BB. 2. Hyperlipidemia - Her most recent NMR showed an LDL particle number of 1888 which is up from 1658, LDL 155, total cholesterol 229 (up from 146) but small LDL particle number has improved to 706.  I have recommended that we switch from Vytorin 10-40mg  daily to Crestor 20mg  daily and repeat an NMR and ALT in 6 weeks.  Unfortunately she has not been able to exercise.  I suspect her elevated LDL is due to lack of exercise as well as approaching menopause.     Medication Adjustments/Labs and Tests Ordered: Current medicines are reviewed at length with the patient today.  Concerns regarding medicines are outlined above.  Medication changes, Labs and Tests ordered today are listed in the Patient Instructions below.  There are no Patient Instructions on file for this visit.   Signed, Armanda Magicraci Janelli Welling, MD  07/13/2016 3:03 PM    [facility] [address], [place], [place]  [address] Phone: [phone]) [phone]; Fax: [phone]) [phone]

## 2016-07-13 NOTE — Patient Instructions (Signed)
Medication Instructions:  1) STOP VYTORIN 2) START CRESTOR 20 mg daily  Labwork: Your physician recommends that you return for FASTING lab work in 6 weeks.   Testing/Procedures: None  Follow-Up: Your physician wants you to follow-up in: 1 year with Dr. Mayford Knifeurner. You will receive a reminder letter in the mail two months in advance. If you don't receive a letter, please call our office to schedule the follow-up appointment.   Any Other Special Instructions Will Be Listed Below (If Applicable).     If you need a refill on your cardiac medications before your next appointment, please call your pharmacy.

## 2016-07-17 DIAGNOSIS — M25572 Pain in left ankle and joints of left foot: Secondary | ICD-10-CM | POA: Diagnosis not present

## 2016-07-19 DIAGNOSIS — M25572 Pain in left ankle and joints of left foot: Secondary | ICD-10-CM | POA: Diagnosis not present

## 2016-07-24 DIAGNOSIS — M25572 Pain in left ankle and joints of left foot: Secondary | ICD-10-CM | POA: Diagnosis not present

## 2016-07-27 DIAGNOSIS — M25572 Pain in left ankle and joints of left foot: Secondary | ICD-10-CM | POA: Diagnosis not present

## 2016-07-29 ENCOUNTER — Other Ambulatory Visit: Payer: Self-pay | Admitting: Cardiology

## 2016-07-31 ENCOUNTER — Encounter: Payer: 59 | Admitting: Podiatry

## 2016-08-02 ENCOUNTER — Ambulatory Visit (INDEPENDENT_AMBULATORY_CARE_PROVIDER_SITE_OTHER): Payer: Self-pay | Admitting: Podiatry

## 2016-08-02 ENCOUNTER — Encounter: Payer: Self-pay | Admitting: Podiatry

## 2016-08-02 ENCOUNTER — Ambulatory Visit (INDEPENDENT_AMBULATORY_CARE_PROVIDER_SITE_OTHER): Payer: 59

## 2016-08-02 ENCOUNTER — Other Ambulatory Visit: Payer: Self-pay

## 2016-08-02 DIAGNOSIS — M2042 Other hammer toe(s) (acquired), left foot: Secondary | ICD-10-CM

## 2016-08-02 DIAGNOSIS — M205X2 Other deformities of toe(s) (acquired), left foot: Secondary | ICD-10-CM | POA: Diagnosis not present

## 2016-08-02 DIAGNOSIS — M722 Plantar fascial fibromatosis: Secondary | ICD-10-CM

## 2016-08-02 NOTE — Progress Notes (Signed)
She presents today for follow-up across and bunion repair second metatarsal osteotomy endoscopic fasciotomy hammertoe repair second left with pin. She states that she is doing much better and has completed 4 sessions of physical therapy but has been unable to find time this week to go to therapy.  Objective: Vital signs are stable she is alert and oriented 3 much decrease in edema to the left foot better range of motion but she still has some dorsal limitation on range of motion of the first metatarsophalangeal joint second metatarsophalangeal joint appears to be healing nicely. Radiographs taken today demonstrate well-healing first and second metatarsal osteotomies.  Assessment: Well-healing surgical foot 2 months.  Plan: Continue physical therapy and get back in regular shoe gear with a normal heel-to-toe gait. I will follow up with her when she has completed physical therapy.

## 2016-08-15 DIAGNOSIS — G43109 Migraine with aura, not intractable, without status migrainosus: Secondary | ICD-10-CM | POA: Diagnosis not present

## 2016-08-21 ENCOUNTER — Other Ambulatory Visit: Payer: 59 | Admitting: *Deleted

## 2016-08-21 DIAGNOSIS — E7800 Pure hypercholesterolemia, unspecified: Secondary | ICD-10-CM | POA: Diagnosis not present

## 2016-08-21 LAB — LIPID PANEL
Chol/HDL Ratio: 3.5 ratio units (ref 0.0–4.4)
Cholesterol, Total: 159 mg/dL (ref 100–199)
HDL: 45 mg/dL (ref >39)
LDL Calculated: 95 mg/dL (ref 0–99)
Triglycerides: 97 mg/dL (ref 0–149)
VLDL CHOLESTEROL CAL: 19 mg/dL (ref 5–40)

## 2016-08-21 LAB — HEPATIC FUNCTION PANEL
ALK PHOS: 111 IU/L (ref 39–117)
ALT: 14 IU/L (ref 0–32)
AST: 18 IU/L (ref 0–40)
Albumin: 4.4 g/dL (ref 3.5–5.5)
BILIRUBIN TOTAL: 0.4 mg/dL (ref 0.0–1.2)
BILIRUBIN, DIRECT: 0.13 mg/dL (ref 0.00–0.40)
Total Protein: 6.6 g/dL (ref 6.0–8.5)

## 2016-08-22 DIAGNOSIS — M25572 Pain in left ankle and joints of left foot: Secondary | ICD-10-CM | POA: Diagnosis not present

## 2016-08-24 DIAGNOSIS — M25572 Pain in left ankle and joints of left foot: Secondary | ICD-10-CM | POA: Diagnosis not present

## 2016-08-29 DIAGNOSIS — M25572 Pain in left ankle and joints of left foot: Secondary | ICD-10-CM | POA: Diagnosis not present

## 2016-08-31 DIAGNOSIS — M25572 Pain in left ankle and joints of left foot: Secondary | ICD-10-CM | POA: Diagnosis not present

## 2016-09-01 DIAGNOSIS — G43809 Other migraine, not intractable, without status migrainosus: Secondary | ICD-10-CM | POA: Insufficient documentation

## 2016-09-01 DIAGNOSIS — G43109 Migraine with aura, not intractable, without status migrainosus: Secondary | ICD-10-CM

## 2016-09-12 DIAGNOSIS — M25572 Pain in left ankle and joints of left foot: Secondary | ICD-10-CM | POA: Diagnosis not present

## 2016-09-14 DIAGNOSIS — M25572 Pain in left ankle and joints of left foot: Secondary | ICD-10-CM | POA: Diagnosis not present

## 2016-09-18 ENCOUNTER — Ambulatory Visit: Payer: 59 | Admitting: Podiatry

## 2016-09-19 DIAGNOSIS — M25572 Pain in left ankle and joints of left foot: Secondary | ICD-10-CM | POA: Diagnosis not present

## 2016-09-21 DIAGNOSIS — M25572 Pain in left ankle and joints of left foot: Secondary | ICD-10-CM | POA: Diagnosis not present

## 2016-09-27 DIAGNOSIS — M25572 Pain in left ankle and joints of left foot: Secondary | ICD-10-CM | POA: Diagnosis not present

## 2016-09-29 DIAGNOSIS — M25572 Pain in left ankle and joints of left foot: Secondary | ICD-10-CM | POA: Diagnosis not present

## 2016-10-04 ENCOUNTER — Ambulatory Visit (INDEPENDENT_AMBULATORY_CARE_PROVIDER_SITE_OTHER): Payer: 59

## 2016-10-04 ENCOUNTER — Ambulatory Visit (INDEPENDENT_AMBULATORY_CARE_PROVIDER_SITE_OTHER): Payer: 59 | Admitting: Podiatry

## 2016-10-04 DIAGNOSIS — M2042 Other hammer toe(s) (acquired), left foot: Secondary | ICD-10-CM

## 2016-10-04 DIAGNOSIS — M722 Plantar fascial fibromatosis: Secondary | ICD-10-CM

## 2016-10-04 DIAGNOSIS — M205X2 Other deformities of toe(s) (acquired), left foot: Secondary | ICD-10-CM

## 2016-10-04 NOTE — Progress Notes (Signed)
She presents today for surgical follow-up date of surgery was 05/27/2016. She is status post [place] bunion repair endoscopic fasciotomy second metatarsal osteotomy states that she continues physical therapy on a regular basis to try to gain range of motion of the first metatarsophalangeal joint and appears to be making some progress. She states that she had a slight setback as she was compensating well lateral aspect of the foot causing more pain.  Objective: Vital signs are stable alert and oriented 3 no erythema edema cellulitis range of motion is good range of motion of the first metatarsal phalangeal joint dorsally limited on plantarflexion to 0. Radiographs confirm while healing osteotomies however she does have a tighter metatarsophalangeal joint since surgery. This is more than likely worsening of her arthritic condition of the first metatarsophalangeal joint.  Assessment: Capsulitis osteoarthritis first metatarsophalangeal joint status post [facility] bunion repair left.  Plan: Discussed her continued physical therapy with her today. Also discussed the possible need for future implant. I'll follow up with her in 2 months just to reevaluate.

## 2016-10-05 DIAGNOSIS — M25572 Pain in left ankle and joints of left foot: Secondary | ICD-10-CM | POA: Diagnosis not present

## 2016-10-17 DIAGNOSIS — M25572 Pain in left ankle and joints of left foot: Secondary | ICD-10-CM | POA: Diagnosis not present

## 2016-10-24 DIAGNOSIS — M25572 Pain in left ankle and joints of left foot: Secondary | ICD-10-CM | POA: Diagnosis not present

## 2016-10-31 DIAGNOSIS — M25572 Pain in left ankle and joints of left foot: Secondary | ICD-10-CM | POA: Diagnosis not present

## 2016-11-08 ENCOUNTER — Ambulatory Visit (INDEPENDENT_AMBULATORY_CARE_PROVIDER_SITE_OTHER): Payer: 59

## 2016-11-08 ENCOUNTER — Ambulatory Visit (INDEPENDENT_AMBULATORY_CARE_PROVIDER_SITE_OTHER): Payer: 59 | Admitting: Podiatry

## 2016-11-08 ENCOUNTER — Encounter: Payer: Self-pay | Admitting: Podiatry

## 2016-11-08 DIAGNOSIS — M722 Plantar fascial fibromatosis: Secondary | ICD-10-CM | POA: Diagnosis not present

## 2016-11-08 DIAGNOSIS — M84375A Stress fracture, left foot, initial encounter for fracture: Secondary | ICD-10-CM | POA: Diagnosis not present

## 2016-11-08 DIAGNOSIS — M76812 Anterior tibial syndrome, left leg: Secondary | ICD-10-CM | POA: Diagnosis not present

## 2016-11-08 NOTE — Progress Notes (Signed)
She presents today concerned about pain along the base of the first metatarsal of the left foot. States that while she was doing physical therapy to help with range of motion first metatarsophalangeal joint she started to experience severe pain and physical therapy 1 to discontinue treatment until she had it checked out.  Objective: Vital signs are stable to alert and oriented 3 radiographs demonstrate well-healing osteotomy soft tissue swelling around the plantar fascia area and along the posterior tibial tendon. Lateral view does demonstrate some swelling of the anterior tendons possibly of the tibialis anterior. Physical exam does demonstrate pain on palpation tibialis anterior tendon and of the plantar fascia left.  Assessment: Compensation resulting in insertional tendinitis of the tibialis anterior left. Also plantar fasciitis left.  Plan: Injected 2 mg of dexamethasone to the point of maximal tenderness at the tibialis anterior insertion site and injected the left heel today with 20 mg of Kenalog and local anesthetic. We'll follow up with her in 1 month. Recommended she continue physical therapy.

## 2016-11-21 DIAGNOSIS — H53001 Unspecified amblyopia, right eye: Secondary | ICD-10-CM | POA: Diagnosis not present

## 2016-11-21 DIAGNOSIS — H16223 Keratoconjunctivitis sicca, not specified as Sjogren's, bilateral: Secondary | ICD-10-CM | POA: Diagnosis not present

## 2016-11-21 DIAGNOSIS — H10413 Chronic giant papillary conjunctivitis, bilateral: Secondary | ICD-10-CM | POA: Diagnosis not present

## 2016-11-29 ENCOUNTER — Ambulatory Visit: Payer: 59 | Admitting: Podiatry

## 2017-01-15 DIAGNOSIS — R42 Dizziness and giddiness: Secondary | ICD-10-CM | POA: Diagnosis not present

## 2017-01-15 DIAGNOSIS — G43109 Migraine with aura, not intractable, without status migrainosus: Secondary | ICD-10-CM | POA: Diagnosis not present

## 2017-03-14 NOTE — Progress Notes (Signed)
56 y.o. [id number] Married Caucasian female here for annual exam.    Stopped her pills after her last annual exam.  Some hot flashes but tolerable. FSH 23.9 and E2 < 15 after stopping OCPs one year ago.  Some lumps in the vaginal area.  No pain, bleeding, or drainage.  Had foot surgery in December and is slow to recover.   Patient asking about Gardasil for her children.  PCP:   Shaune Pollack, MD  Patient's last menstrual period was 02/25/2016.           Sexually active: Yes.    The current method of family planning is post menopausal status.    Exercising: No.  The patient does not participate in regular exercise at present. Smoker:  no  Health Maintenance: Pap: 03-10-16 Neg:Neg HR HPV, 02-19-13 Neg:Neg HR HPV History of abnormal Pap:  no MMG: 03/20/16 -- SEE EPIC.  Sebaceous cyst of the right breast.   Due for bilateral screening mammogram Nov. 2017.  Colonoscopy: 2015 normal with Dr. Loreta Ave. Next due 2025. BMD:   n/a  Result  n/a TDaP: PCP Gardasil:   no HIV:Neg with Pregnancy Hep C: 04-03-16 Neg Screening Labs:  Dr. Mayford Knife -- Cardiologist   reports that she has never smoked. She has never used smokeless tobacco. She reports that she does not drink alcohol or use drugs.  Past Medical History:  Diagnosis Date  . Anxiety   . Dry eye   . Endometriosis   . GERD (gastroesophageal reflux disease)   . Hernia    D. Jarold Motto  . Hyperlipidemia   . PAC (premature atrial contraction)     Past Surgical History:  Procedure Laterality Date  . CESAREAN SECTION  1998&2000   x2  . FOOT SURGERY Left 05/2016  . HEMORRHOID SURGERY    . LAPAROSCOPY     endometriosis  . TUBAL LIGATION      Current Outpatient Prescriptions  Medication Sig Dispense Refill  . Cholecalciferol (VITAMIN D PO) Take by mouth daily.    . nebivolol (BYSTOLIC) 5 MG tablet Take 1 tablet (5 mg total) by mouth daily. 90 tablet 3  . rosuvastatin (CRESTOR) 20 MG tablet Take 1 tablet (20 mg total) by mouth daily. 30  tablet 11  . sertraline (ZOLOFT) 100 MG tablet Take 100 mg by mouth daily.     No current facility-administered medications for this visit.     Family History  Problem Relation Age of Onset  . Heart disease Mother   . Stroke Mother   . Diabetes Mother   . CVA Mother 19       on dialysis  . Hypertension Mother   . Hyperlipidemia Mother   . Cancer Father        pancreatic  . Cancer Sister        Cervical  . Cancer Maternal Grandmother        Lung    ROS:  Pertinent items are noted in HPI.  Otherwise, a comprehensive ROS was negative.  Exam:   BP 102/60 (BP Location: Right Arm, Patient Position: Sitting, Cuff Size: Normal)   Pulse 72   Resp 16   Ht 5' 6.5" (1.689 m)   Wt 195 lb (88.5 kg)   LMP 02/25/2016 Comment: spotting only - on BC  BMI 31.00 kg/m     General appearance: alert, cooperative and appears stated age Head: Normocephalic, without obvious abnormality, atraumatic Neck: no adenopathy, supple, symmetrical, trachea midline and thyroid normal to inspection and palpation  Lungs: clear to auscultation bilaterally Breasts: Right breast with 4 mm firm area of skin along elastic band area from bra.  Bilateral breasts with normal appearance, no tenderness, No nipple retraction or dimpling, No nipple discharge or bleeding, No axillary or supraclavicular adenopathy Heart: regular rate and rhythm Abdomen: soft, non-tender; no masses, no organomegaly Extremities: extremities normal, atraumatic, no cyanosis or edema Skin: Skin color, texture, turgor normal. No rashes or lesions Lymph nodes: Cervical, supraclavicular, and axillary nodes normal. No abnormal inguinal nodes palpated Neurologic: Grossly normal  Pelvic: External genitalia:   Small vulvar cysts.                Urethra:  normal appearing urethra with no masses, tenderness or lesions              Bartholins and Skenes: normal                 Vagina: normal appearing vagina with normal color and discharge, no  lesions              Cervix:  4 mm red area of the 2:00 position of the cervix.              Pap taken: Yes.   Bimanual Exam:  Uterus:  normal size, contour, position, consistency, mobility, non-tender              Adnexa: no mass, fullness, tenderness              Rectal exam: Yes.  .  Confirms.              Anus:  normal sphincter tone, no lesions  Chaperone was present for exam.  Assessment:   Well woman visit with normal exam. Erythematous area of the cervix at 2:00. Sebaceous cyst of the right breast.   Plan: Mammogram screening discussed. Recommended self breast awareness. Pap and HR HPV as above. Guidelines for Calcium, Vitamin D, regular exercise program including cardiovascular and weight bearing exercise. Labs with PCP.  We discussed Gardasil vaccine and I have written information. Follow up annually and prn.   After visit summary provided.

## 2017-03-15 ENCOUNTER — Other Ambulatory Visit (HOSPITAL_COMMUNITY)
Admission: RE | Admit: 2017-03-15 | Discharge: 2017-03-15 | Disposition: A | Discharge status: Home / Self Care | Payer: 59 | Source: Ambulatory Visit | Attending: Obstetrics and Gynecology | Admitting: Obstetrics and Gynecology

## 2017-03-15 ENCOUNTER — Encounter: Payer: Self-pay | Admitting: Obstetrics and Gynecology

## 2017-03-15 ENCOUNTER — Ambulatory Visit (INDEPENDENT_AMBULATORY_CARE_PROVIDER_SITE_OTHER): Payer: 59 | Admitting: Obstetrics and Gynecology

## 2017-03-15 VITALS — BP 102/60 | HR 72 | Resp 16 | Ht 66.5 in | Wt 195.0 lb

## 2017-03-15 DIAGNOSIS — Z01419 Encounter for gynecological examination (general) (routine) without abnormal findings: Secondary | ICD-10-CM | POA: Insufficient documentation

## 2017-03-15 NOTE — Patient Instructions (Signed)
Human Papillomavirus Quadrivalent Vaccine suspension for injection What is this medicine? HUMAN PAPILLOMAVIRUS VACCINE (HYOO muhn pap uh LOH muh vahy ruhs vak SEEN) is a vaccine. It is used to prevent infections of four types of the human papillomavirus. In women, the vaccine may lower your risk of getting cervical, vaginal, vulvar, or anal cancer and genital warts. In men, the vaccine may lower your risk of getting genital warts and anal cancer. You cannot get these diseases from the vaccine. This vaccine does not treat these diseases. This medicine may be used for other purposes; ask your health care provider or pharmacist if you have questions. COMMON BRAND NAME(S): Gardasil What should I tell my health care provider before I take this medicine? They need to know if you have any of these conditions: -fever or infection -hemophilia -HIV infection or AIDS -immune system problems -low platelet count -an unusual reaction to Human Papillomavirus Vaccine, yeast, other medicines, foods, dyes, or preservatives -pregnant or trying to get pregnant -breast-feeding How should I use this medicine? This vaccine is for injection in a muscle on your upper arm or thigh. It is given by a health care professional. Dennis Bast will be observed for 15 minutes after each dose. Sometimes, fainting happens after the vaccine is given. You may be asked to sit or lie down during the 15 minutes. Three doses are given. The second dose is given 2 months after the first dose. The last dose is given 4 months after the second dose. A copy of a Vaccine Information Statement will be given before each vaccination. Read this sheet carefully each time. The sheet may change frequently. Talk to your pediatrician regarding the use of this medicine in children. While this drug may be prescribed for children as young as 11 years of age for selected conditions, precautions do apply. Overdosage: If you think you have taken too much of this  medicine contact a poison control center or emergency room at once. NOTE: This medicine is only for you. Do not share this medicine with others. What if I miss a dose? All 3 doses of the vaccine should be given within 6 months. Remember to keep appointments for follow-up doses. Your health care provider will tell you when to return for the next vaccine. Ask your health care professional for advice if you are unable to keep an appointment or miss a scheduled dose. What may interact with this medicine? -other vaccines This list may not describe all possible interactions. Give your health care provider a list of all the medicines, herbs, non-prescription drugs, or dietary supplements you use. Also tell them if you smoke, drink alcohol, or use illegal drugs. Some items may interact with your medicine. What should I watch for while using this medicine? This vaccine may not fully protect everyone. Continue to have regular pelvic exams and cervical or anal cancer screenings as directed by your doctor. The Human Papillomavirus is a sexually transmitted disease. It can be passed by any kind of sexual activity that involves genital contact. The vaccine works best when given before you have any contact with the virus. Many people who have the virus do not have any signs or symptoms. Tell your doctor or health care professional if you have any reaction or unusual symptom after getting the vaccine. What side effects may I notice from receiving this medicine? Side effects that you should report to your doctor or health care professional as soon as possible: -allergic reactions like skin rash, itching or hives, swelling  of the face, lips, or tongue -breathing problems -feeling faint or lightheaded, falls Side effects that usually do not require medical attention (report to your doctor or health care professional if they continue or are bothersome): -cough -dizziness -fever -headache -nausea -redness, warmth,  swelling, pain, or itching at site where injected This list may not describe all possible side effects. Call your doctor for medical advice about side effects. You may report side effects to FDA at 1-800-FDA-1088. Where should I keep my medicine? This drug is given in a hospital or clinic and will not be stored at home. NOTE: This sheet is a summary. It may not cover all possible information. If you have questions about this medicine, talk to your doctor, pharmacist, or health care provider.  2018 Elsevier/Gold Standard (2013-07-21 13:14:33)   EXERCISE AND DIET:  We recommended that you start or continue a regular exercise program for good health. Regular exercise means any activity that makes your heart beat faster and makes you sweat.  We recommend exercising at least 30 minutes per day at least 3 days a week, preferably 4 or 5.  We also recommend a diet low in fat and sugar.  Inactivity, poor dietary choices and obesity can cause diabetes, heart attack, stroke, and kidney damage, among others.    ALCOHOL AND SMOKING:  Women should limit their alcohol intake to no more than 7 drinks/beers/glasses of wine (combined, not each!) per week. Moderation of alcohol intake to this level decreases your risk of breast cancer and liver damage. And of course, no recreational drugs are part of a healthy lifestyle.  And absolutely no smoking or even second hand smoke. Most people know smoking can cause heart and lung diseases, but did you know it also contributes to weakening of your bones? Aging of your skin?  Yellowing of your teeth and nails?  CALCIUM AND VITAMIN D:  Adequate intake of calcium and Vitamin D are recommended.  The recommendations for exact amounts of these supplements seem to change often, but generally speaking 600 mg of calcium (either carbonate or citrate) and 800 units of Vitamin D per day seems prudent. Certain women may benefit from higher intake of Vitamin D.  If you are among these women,  your doctor will have told you during your visit.    PAP SMEARS:  Pap smears, to check for cervical cancer or precancers,  have traditionally been done yearly, although recent scientific advances have shown that most women can have pap smears less often.  However, every woman still should have a physical exam from her gynecologist every year. It will include a breast check, inspection of the vulva and vagina to check for abnormal growths or skin changes, a visual exam of the cervix, and then an exam to evaluate the size and shape of the uterus and ovaries.  And after 54 years of age, a rectal exam is indicated to check for rectal cancers. We will also provide age appropriate advice regarding health maintenance, like when you should have certain vaccines, screening for sexually transmitted diseases, bone density testing, colonoscopy, mammograms, etc.   MAMMOGRAMS:  All women over 64 years old should have a yearly mammogram. Many facilities now offer a "3D" mammogram, which may cost around $50 extra out of pocket. If possible,  we recommend you accept the option to have the 3D mammogram performed.  It both reduces the number of women who will be called back for extra views which then turn out to be normal, and  it is better than the routine mammogram at detecting truly abnormal areas.    COLONOSCOPY:  Colonoscopy to screen for colon cancer is recommended for all women at age 59.  We know, you hate the idea of the prep.  We agree, BUT, having colon cancer and not knowing it is worse!!  Colon cancer so often starts as a polyp that can be seen and removed at colonscopy, which can quite literally save your life!  And if your first colonoscopy is normal and you have no family history of colon cancer, most women don't have to have it again for 10 years.  Once every ten years, you can do something that may end up saving your life, right?  We will be happy to help you get it scheduled when you are ready.  Be sure to  check your insurance coverage so you understand how much it will cost.  It may be covered as a preventative service at no cost, but you should check your particular policy.

## 2017-03-16 LAB — CYTOLOGY - PAP
DIAGNOSIS: NEGATIVE
HPV (WINDOPATH): NOT DETECTED

## 2017-03-23 ENCOUNTER — Telehealth: Payer: Self-pay | Admitting: Obstetrics and Gynecology

## 2017-03-23 NOTE — Telephone Encounter (Signed)
Patient returning your call.

## 2017-03-23 NOTE — Telephone Encounter (Signed)
Spoke with patient, advised as seen below per Dr. Silva. Patient verbalizes understanding and is agreeable.   Routing to provider for final review. Patient is agreeable to disposition. Will close encounter.  

## 2017-03-23 NOTE — Telephone Encounter (Signed)
Patient requesting pap results

## 2017-03-23 NOTE — Telephone Encounter (Signed)
Left message to call Noreene Larsson at [phone].   Notes recorded by Patton Salles, MD on 03/17/2017 at 4:12 PM EDT Results to patient through My Chart. Recall - 02.   Hello Kim Young,   Your pap and high risk HPV testing are normal and negative.   Please contact the office for any questions.   Have a good day!  Conley Simmonds, MD

## 2017-04-11 DIAGNOSIS — R7303 Prediabetes: Secondary | ICD-10-CM | POA: Diagnosis not present

## 2017-05-08 ENCOUNTER — Telehealth: Payer: Self-pay | Admitting: Obstetrics and Gynecology

## 2017-05-08 NOTE — Telephone Encounter (Signed)
Left message to call Kim Young at [phone].  Patient will need OV with Dr.Silva or covering provider. Left message on her voicemail notifying her she will need an OV for assessment.

## 2017-05-08 NOTE — Telephone Encounter (Signed)
Patient called and requested [facility] for a cyst she has under her right breast. She is aware she may need to come in for a visit first and that a nurse will call her back to help guide her through the scheduling and care process.  Last seen: 03/15/17

## 2017-05-10 DIAGNOSIS — N6081 Other benign mammary dysplasias of right breast: Secondary | ICD-10-CM | POA: Diagnosis not present

## 2017-05-12 ENCOUNTER — Other Ambulatory Visit: Payer: Self-pay | Admitting: Family Medicine

## 2017-05-12 DIAGNOSIS — N6081 Other benign mammary dysplasias of right breast: Secondary | ICD-10-CM

## 2017-05-14 ENCOUNTER — Ambulatory Visit
Admission: RE | Admit: 2017-05-14 | Discharge: 2017-05-14 | Disposition: A | Discharge status: Home / Self Care | Payer: 59 | Source: Ambulatory Visit | Attending: Family Medicine | Admitting: Family Medicine

## 2017-05-14 DIAGNOSIS — N6081 Other benign mammary dysplasias of right breast: Secondary | ICD-10-CM

## 2017-05-14 DIAGNOSIS — R928 Other abnormal and inconclusive findings on diagnostic imaging of breast: Secondary | ICD-10-CM | POA: Diagnosis not present

## 2017-05-14 DIAGNOSIS — N6489 Other specified disorders of breast: Secondary | ICD-10-CM | POA: Diagnosis not present

## 2017-05-15 ENCOUNTER — Other Ambulatory Visit: Payer: 59

## 2017-05-17 NOTE — Telephone Encounter (Signed)
Message left to return call to Triage RN at [phone].    

## 2017-05-18 NOTE — Telephone Encounter (Signed)
Patient returned call and states she had breast cyst evaluated by PCP, Dr. Kevan NyGates, and had a [facility] and [facility] on 05/14/17. Reports available in Epic.   Routing to provider as Lorain ChildesFYI. Patient agreeable to disposition. Will close encounter.

## 2017-08-08 ENCOUNTER — Other Ambulatory Visit: Payer: Self-pay | Admitting: Cardiology

## 2017-09-13 ENCOUNTER — Telehealth: Payer: Self-pay | Admitting: Cardiology

## 2017-09-13 DIAGNOSIS — E785 Hyperlipidemia, unspecified: Secondary | ICD-10-CM

## 2017-09-13 MED ORDER — ROSUVASTATIN CALCIUM 20 MG PO TABS: 20.0000 mg | ORAL_TABLET | Freq: Every day | ORAL | 2 refills | Status: DC

## 2017-09-13 MED ORDER — NEBIVOLOL HCL 5 MG PO TABS: 5.0000 mg | ORAL_TABLET | Freq: Every day | ORAL | 2 refills | Status: DC

## 2017-09-13 NOTE — Telephone Encounter (Signed)
Patient requesting to have labs drawn prior to her OV with Dr. Mayford Knifeurner on 10/30/17. Patient scheduled for FLP and liver function on 10/23/17. Refill sent for crestor and Bystolic to last till her OV. Patient verbalized understanding and thankful for the call.

## 2017-09-13 NOTE — Telephone Encounter (Signed)
NEW MESSAGE    Patient requesting order for labs.

## 2017-10-23 ENCOUNTER — Other Ambulatory Visit: Payer: 59

## 2017-10-30 ENCOUNTER — Ambulatory Visit: Payer: 59 | Admitting: Cardiology

## 2017-10-30 ENCOUNTER — Encounter: Payer: Self-pay | Admitting: Cardiology

## 2017-10-30 VITALS — BP 114/60 | HR 56 | Ht 66.5 in | Wt 209.0 lb

## 2017-10-30 DIAGNOSIS — E7800 Pure hypercholesterolemia, unspecified: Secondary | ICD-10-CM | POA: Diagnosis not present

## 2017-10-30 DIAGNOSIS — I491 Atrial premature depolarization: Secondary | ICD-10-CM

## 2017-10-30 NOTE — Patient Instructions (Signed)
Medication Instructions:  Your physician recommends that you continue on your current medications as directed. Please refer to the Current Medication list given to you today.  If you need a refill on your cardiac medications, please contact your pharmacy first.  Labwork: Your physician recommends that you return for lab work in: 1 week for fasting lipid panel and liver function test    Testing/Procedures: None ordered   Follow-Up: Your physician wants you to follow-up AS NEEDED with Dr. Mayford Knife   Any Other Special Instructions Will Be Listed Below (If Applicable).   Thank you for choosing [facility]    Lyda Perone, RN  [phone]  If you need a refill on your cardiac medications before your next appointment, please call your pharmacy.

## 2017-10-30 NOTE — Progress Notes (Signed)
Cardiology Office Note:    Date:  10/30/2017   ID:  Kim Young, DOB [birth year], MRN [id number]  PCP:  Patient, No Pcp Per  Cardiologist:  No primary care provider on file.    Referring MD: Shaune Pollack, MD   Chief Complaint  Patient presents with  . Hyperlipidemia    History of Present Illness:    Kim Young is a 57 y.o. female with a hx of PAC's and dyslipidemia.  She is here today for followup and is doing well.  She denies any chest pain or pressure, SOB, DOE, PND, orthopnea, LE edema, dizziness, palpitations or syncope. She is compliant with her meds and is tolerating meds with no SE.    Past Medical History:  Diagnosis Date  . Anxiety   . Dry eye   . Endometriosis   . GERD (gastroesophageal reflux disease)   . Hernia    D. Jarold Motto  . Hyperlipidemia   . PAC (premature atrial contraction)     Past Surgical History:  Procedure Laterality Date  . CESAREAN SECTION  1998&2000   x2  . FOOT SURGERY Left 05/2016  . HEMORRHOID SURGERY    . LAPAROSCOPY     endometriosis  . TUBAL LIGATION      Current Medications: No outpatient medications have been marked as taking for the 10/30/17 encounter (Office Visit) with Quintella Reichert, MD.     Allergies:   Cymbalta [duloxetine hcl]; Penicillin g; and Penicillins   Social History   Socioeconomic History  . Marital status: Married    Spouse name: Not on file  . Number of children: Not on file  . Years of education: Not on file  . Highest education level: Not on file  Occupational History  . Not on file  Social Needs  . Financial resource strain: Not on file  . Food insecurity:    Worry: Not on file    Inability: Not on file  . Transportation needs:    Medical: Not on file    Non-medical: Not on file  Tobacco Use  . Smoking status: Never Smoker  . Smokeless tobacco: Never Used  Substance and Sexual Activity  . Alcohol use: No    Alcohol/week: 0.0 oz  . Drug use: No  . Sexual activity: Yes   Partners: Male    Birth control/protection: Post-menopausal  Lifestyle  . Physical activity:    Days per week: Not on file    Minutes per session: Not on file  . Stress: Not on file  Relationships  . Social connections:    Talks on phone: Not on file    Gets together: Not on file    Attends religious service: Not on file    Active member of club or organization: Not on file    Attends meetings of clubs or organizations: Not on file    Relationship status: Not on file  Other Topics Concern  . Not on file  Social History Narrative  . Not on file     Family History: The patient's family history includes CVA (age of onset: 16) in her mother; Cancer in her father, maternal grandmother, and sister; Diabetes in her mother; Heart disease in her mother; Hyperlipidemia in her mother; Hypertension in her mother; Stroke in her mother.  ROS:   Please see the history of present illness.    ROS  All other systems reviewed and negative.   EKGs/Labs/Other Studies Reviewed:    The following studies were reviewed today: none  EKG:  EKG is  ordered today.  The ekg ordered today demonstrates sinus bradycardia 56 bpm with no ST-T wave of normalities.  Recent Labs: No results found for requested labs within last 8760 hours.   Recent Lipid Panel    Component Value Date/Time   CHOL 159 08/21/2016 0737   CHOL 158 05/28/2015 0917   CHOL 268 (H) 05/06/2014 0739   TRIG 97 08/21/2016 0737   TRIG 124 06/27/2016 0809   TRIG 185 (H) 05/06/2014 0739   HDL 45 08/21/2016 0737   HDL 49 06/27/2016 0809   HDL 44 05/06/2014 0739   CHOLHDL 3.5 08/21/2016 0737   CHOLHDL 3.4 05/28/2015 0917   CHOLHDL 3 09/29/2014 0740   VLDL 20.0 09/29/2014 0740   LDLCALC 95 08/21/2016 0737   LDLCALC 84 05/28/2015 0917   LDLCALC 187 (H) 05/06/2014 0739    Physical Exam:    VS:  BP 114/60   Pulse (!) 56   Ht 5' 6.5" (1.689 m)   Wt 209 lb (94.8 kg)   LMP 02/25/2016 Comment: spotting only - on BC  BMI 33.23  kg/m     Wt Readings from Last 3 Encounters:  10/30/17 209 lb (94.8 kg)  03/15/17 195 lb (88.5 kg)  07/13/16 202 lb (91.6 kg)     GEN:  Well nourished, well developed in no acute distress HEENT: Normal NECK: No JVD; No carotid bruits LYMPHATICS: No lymphadenopathy CARDIAC: RRR, no murmurs, rubs, gallops RESPIRATORY:  Clear to auscultation without rales, wheezing or rhonchi  ABDOMEN: Soft, non-tender, non-distended MUSCULOSKELETAL:  No edema; No deformity  SKIN: Warm and dry NEUROLOGIC:  Alert and oriented x 3 PSYCHIATRIC:  Normal affect   ASSESSMENT:    1. PAC (premature atrial contraction)   2. Pure hypercholesterolemia    PLAN:    In order of problems listed above:  1.  PACs - She is asymptomatic.  Continue Bystolic 5 mg daily.  2.  Hyperlipidemia - continue Crestor 20 mg daily.  Check FLP and ALT.  Her cholesterol has been stable and if lipid panel is stable again after recheck I am going to have her follow-up with her PCP.  We will see me back on a as needed basis.   Medication Adjustments/Labs and Tests Ordered: Current medicines are reviewed at length with the patient today.  Concerns regarding medicines are outlined above.  Orders Placed This Encounter  Procedures  . EKG 12-Lead   No orders of the defined types were placed in this encounter.   Signed, Armanda Magic, MD  10/30/2017 3:51 PM    [facility]

## 2017-10-31 ENCOUNTER — Other Ambulatory Visit: Payer: 59 | Admitting: *Deleted

## 2017-10-31 DIAGNOSIS — E7800 Pure hypercholesterolemia, unspecified: Secondary | ICD-10-CM | POA: Diagnosis not present

## 2017-10-31 LAB — LIPID PANEL
CHOLESTEROL TOTAL: 143 mg/dL (ref 100–199)
Chol/HDL Ratio: 2.8 ratio (ref 0.0–4.4)
HDL: 52 mg/dL (ref >39)
LDL Calculated: 75 mg/dL (ref 0–99)
Triglycerides: 79 mg/dL (ref 0–149)
VLDL CHOLESTEROL CAL: 16 mg/dL (ref 5–40)

## 2017-10-31 LAB — HEPATIC FUNCTION PANEL
ALT: 11 IU/L (ref 0–32)
AST: 18 IU/L (ref 0–40)
Albumin: 4.5 g/dL (ref 3.5–5.5)
Alkaline Phosphatase: 108 IU/L (ref 39–117)
BILIRUBIN TOTAL: 0.5 mg/dL (ref 0.0–1.2)
Bilirubin, Direct: 0.16 mg/dL (ref 0.00–0.40)
Total Protein: 6.8 g/dL (ref 6.0–8.5)

## 2017-12-20 ENCOUNTER — Other Ambulatory Visit: Payer: Self-pay | Admitting: Cardiology

## 2017-12-21 ENCOUNTER — Telehealth: Payer: Self-pay

## 2017-12-21 NOTE — Telephone Encounter (Signed)
**Note De-Identified Jeralynn Vaquera Obfuscation** Message received as follows from covermymeds concerning this Bystolic PA:  Rondall Allegraeleste Pinney Key: [id number] - PA Case ID: [id number]  Outcome  N/Atoday  This medication is on your plan's list of covered drugs. Prior authorization is not required at this time. If your pharmacy has questions regarding the processing of your prescription, please have them call the [facility] pharmacy help desk at [phone]. For additional information, the member can contact Member Services by calling the number on the back of their ID Card.  [facility] to make them aware that a PA is not required as Bystolic is on the plans formulary. The last time I called I was on hold for more tham 12 mins so I printed this message from covermymeds and [facility] so they will be aware that a PA is not needed.

## 2017-12-21 NOTE — Telephone Encounter (Signed)
I have done a Bystolic PA through covermymeds. Key: [id number]

## 2017-12-28 DIAGNOSIS — H10413 Chronic giant papillary conjunctivitis, bilateral: Secondary | ICD-10-CM | POA: Diagnosis not present

## 2017-12-28 DIAGNOSIS — H04123 Dry eye syndrome of bilateral lacrimal glands: Secondary | ICD-10-CM | POA: Diagnosis not present

## 2018-03-13 DIAGNOSIS — Z23 Encounter for immunization: Secondary | ICD-10-CM | POA: Diagnosis not present

## 2018-03-21 ENCOUNTER — Ambulatory Visit: Payer: 59 | Admitting: Obstetrics and Gynecology

## 2018-06-07 ENCOUNTER — Ambulatory Visit: Payer: 59 | Admitting: Obstetrics and Gynecology

## 2018-06-14 DIAGNOSIS — E785 Hyperlipidemia, unspecified: Secondary | ICD-10-CM | POA: Diagnosis not present

## 2018-06-14 DIAGNOSIS — I491 Atrial premature depolarization: Secondary | ICD-10-CM | POA: Diagnosis not present

## 2018-07-05 DIAGNOSIS — R7303 Prediabetes: Secondary | ICD-10-CM | POA: Diagnosis not present

## 2018-07-05 DIAGNOSIS — E785 Hyperlipidemia, unspecified: Secondary | ICD-10-CM | POA: Diagnosis not present

## 2018-10-03 ENCOUNTER — Other Ambulatory Visit: Payer: Self-pay | Admitting: Cardiology

## 2019-10-14 ENCOUNTER — Other Ambulatory Visit: Payer: Self-pay | Admitting: Family Medicine

## 2019-10-14 DIAGNOSIS — Z1231 Encounter for screening mammogram for malignant neoplasm of breast: Secondary | ICD-10-CM

## 2020-01-22 ENCOUNTER — Other Ambulatory Visit: Payer: Self-pay

## 2020-01-22 ENCOUNTER — Ambulatory Visit
Admission: RE | Admit: 2020-01-22 | Discharge: 2020-01-22 | Disposition: A | Discharge status: Home / Self Care | Payer: 59 | Source: Ambulatory Visit | Attending: Family Medicine | Admitting: Family Medicine

## 2020-01-22 DIAGNOSIS — Z1231 Encounter for screening mammogram for malignant neoplasm of breast: Secondary | ICD-10-CM

## 2021-05-10 ENCOUNTER — Other Ambulatory Visit: Payer: Self-pay | Admitting: Family Medicine

## 2021-05-10 ENCOUNTER — Other Ambulatory Visit: Payer: Self-pay | Admitting: Genetic Counselor

## 2021-05-10 DIAGNOSIS — Z1231 Encounter for screening mammogram for malignant neoplasm of breast: Secondary | ICD-10-CM

## 2021-06-15 ENCOUNTER — Other Ambulatory Visit: Payer: Self-pay

## 2021-06-15 ENCOUNTER — Ambulatory Visit
Admission: RE | Admit: 2021-06-15 | Discharge: 2021-06-15 | Disposition: A | Discharge status: Home / Self Care | Payer: 59 | Source: Ambulatory Visit | Attending: Family Medicine | Admitting: Family Medicine

## 2021-06-15 DIAGNOSIS — Z1231 Encounter for screening mammogram for malignant neoplasm of breast: Secondary | ICD-10-CM

## 2021-06-17 ENCOUNTER — Other Ambulatory Visit: Payer: Self-pay | Admitting: Family Medicine

## 2021-06-17 DIAGNOSIS — N644 Mastodynia: Secondary | ICD-10-CM

## 2021-07-15 ENCOUNTER — Ambulatory Visit
Admission: RE | Admit: 2021-07-15 | Discharge: 2021-07-15 | Disposition: A | Discharge status: Home / Self Care | Payer: 59 | Source: Ambulatory Visit | Attending: Family Medicine | Admitting: Family Medicine

## 2021-07-15 ENCOUNTER — Other Ambulatory Visit: Payer: Self-pay

## 2021-07-15 DIAGNOSIS — N644 Mastodynia: Secondary | ICD-10-CM

## 2021-07-25 ENCOUNTER — Other Ambulatory Visit: Payer: 59

## 2022-01-24 NOTE — Progress Notes (Unsigned)
61 y.o. G26P2002 Married Caucasian female here for annual exam.    Patient last seen in 2018.  Patient complaining of vaginal itching off and on for long time. Irritation is more vulvar along the labia. No discharge or odor.  Uses Vagisil and it improves.   Has a sebaceous cyst along the bra line.  Radiology suggested removal.  She saw dermatology who does not does removal in the office.   No menopause concerns.   PCP: Farris Has, MD     Patient's last menstrual period was 02/25/2016.           Sexually active: Yes.    The current method of family planning is post menopausal status.    Exercising: No.  The patient does not participate in regular exercise at present. Smoker:  no  Health Maintenance: Pap:  03-15-17 Neg:Neg HR HPV, 03-10-16 Neg:Neg HR HPV, 02-19-13 Neg:Neg HR HPV History of abnormal Pap:  no MMG:  07-15-21 Diag.Bil.w/Rt.US//Rt.Br.poss.ruptured sebaceous cyst/Neg/BiRads2 Colonoscopy:  2015 normal;10 years BMD:   n/a  Result  n/a TDaP:  PCP Gardasil:   no HIV: Neg in past Hep C: 04-03-16 Neg Screening Labs:  PCP Shingrix:  recommended.    reports that she has never smoked. She has never used smokeless tobacco. She reports that she does not drink alcohol and does not use drugs.  Past Medical History:  Diagnosis Date   Anxiety    Dry eye    Endometriosis    GERD (gastroesophageal reflux disease)    Hernia    D. Patterson   Hyperlipidemia    PAC (premature atrial contraction)     Past Surgical History:  Procedure Laterality Date   CESAREAN SECTION  1998&2000   x2   FOOT SURGERY Left 05/2016   HEMORRHOID SURGERY     LAPAROSCOPY     endometriosis   TUBAL LIGATION      Current Outpatient Medications  Medication Sig Dispense Refill   nebivolol (BYSTOLIC) 5 MG tablet Take 1 tablet (5 mg total) by mouth daily. Please make yearly appt with Dr. Mayford Knife for May before anymore refills. 1st attempt 90 tablet 0   rosuvastatin (CRESTOR) 20 MG tablet Take 1  tablet (20 mg total) by mouth daily. Please make yearly appt with Dr. Mayford Knife for May before anymore refills. 1st attempt 90 tablet 0   sertraline (ZOLOFT) 100 MG tablet Take 100 mg by mouth daily.     No current facility-administered medications for this visit.    Family History  Problem Relation Age of Onset   Heart disease Mother    Stroke Mother    Diabetes Mother    CVA Mother 62       on dialysis   Hypertension Mother    Hyperlipidemia Mother    Cancer Father        pancreatic   Cancer Sister        Cervical   Cancer Maternal Grandmother        Lung    Review of Systems  Genitourinary:  Positive for vaginal pain (vaginal itching).  All other systems reviewed and are negative.   Exam:   BP 110/64   Pulse 80   Ht 5' 6.75" (1.695 m)   Wt 190 lb (86.2 kg)   LMP 02/25/2016 Comment: spotting only - on BC  SpO2 95%   BMI 29.98 kg/m     General appearance: alert, cooperative and appears stated age Head: normocephalic, without obvious abnormality, atraumatic Neck: no adenopathy, supple, symmetrical,  trachea midline and thyroid normal to inspection and palpation Lungs: clear to auscultation bilaterally Breasts: normal appearance, no masses or tenderness, No nipple retraction or dimpling, No nipple discharge or bleeding, No axillary adenopathy Heart: regular rate and rhythm Abdomen: soft, non-tender; no masses, no organomegaly Extremities: extremities normal, atraumatic, no cyanosis or edema Skin: skin color, texture, turgor normal. No rashes or lesions Lymph nodes: cervical, supraclavicular, and axillary nodes normal. Neurologic: grossly normal  Pelvic: External genitalia:  sebaceous cysts of the left labia majora.               No abnormal inguinal nodes palpated.              Urethra:  normal appearing urethra with no masses, tenderness or lesions              Bartholins and Skenes: normal                 Vagina: normal appearing vagina with normal color and  discharge, no lesions              Cervix: no lesions              Pap taken: yes Bimanual Exam:  Uterus:  normal size, contour, position, consistency, mobility, non-tender              Adnexa: no mass, fullness, tenderness              Rectal exam: yes.  Confirms.              Anus:  normal sphincter tone, no lesions  Chaperone was present for exam:  Marchelle Folks, CMA  Assessment:   Well woman visit with gynecologic exam. Vulvovaginitis. Sebaceous cyst of the right breast.  Not noted today on my exam.  Plan: Mammogram screening discussed. Self breast awareness reviewed. Pap and HR HPV as above. Guidelines for Calcium, Vitamin D, regular exercise program including cardiovascular and weight bearing exercise. Wet prep:  negative.  Rx for Triamcinolone 0.25%, apply bid prn.  We discussed avoiding irritants.  Follow up annually and prn.   After visit summary provided.

## 2022-01-26 ENCOUNTER — Encounter: Payer: Self-pay | Admitting: Obstetrics and Gynecology

## 2022-01-26 ENCOUNTER — Other Ambulatory Visit (HOSPITAL_COMMUNITY)
Admission: RE | Admit: 2022-01-26 | Discharge: 2022-01-26 | Disposition: A | Discharge status: Home / Self Care | Payer: 59 | Source: Ambulatory Visit | Attending: Obstetrics and Gynecology | Admitting: Obstetrics and Gynecology

## 2022-01-26 ENCOUNTER — Ambulatory Visit (INDEPENDENT_AMBULATORY_CARE_PROVIDER_SITE_OTHER): Payer: 59 | Admitting: Obstetrics and Gynecology

## 2022-01-26 VITALS — BP 110/64 | HR 80 | Ht 66.75 in | Wt 190.0 lb

## 2022-01-26 DIAGNOSIS — Z124 Encounter for screening for malignant neoplasm of cervix: Secondary | ICD-10-CM | POA: Diagnosis present

## 2022-01-26 DIAGNOSIS — N760 Acute vaginitis: Secondary | ICD-10-CM | POA: Diagnosis not present

## 2022-01-26 DIAGNOSIS — Z01419 Encounter for gynecological examination (general) (routine) without abnormal findings: Secondary | ICD-10-CM

## 2022-01-26 LAB — WET PREP FOR TRICH, YEAST, CLUE

## 2022-01-26 MED ORDER — TRIAMCINOLONE ACETONIDE 0.025 % EX OINT: 1.0000 | TOPICAL_OINTMENT | Freq: Two times a day (BID) | CUTANEOUS | 1 refills | Status: DC

## 2022-01-26 NOTE — Patient Instructions (Signed)
EXERCISE AND DIET:  We recommended that you start or continue a regular exercise program for good health. Regular exercise means any activity that makes your heart beat faster and makes you sweat.  We recommend exercising at least 30 minutes per day at least 3 days a week, preferably 4 or 5.  We also recommend a diet low in fat and sugar.  Inactivity, poor dietary choices and obesity can cause diabetes, heart attack, stroke, and kidney damage, among others.    ALCOHOL AND SMOKING:  Women should limit their alcohol intake to no more than 7 drinks/beers/glasses of wine (combined, not each!) per week. Moderation of alcohol intake to this level decreases your risk of breast cancer and liver damage. And of course, no recreational drugs are part of a healthy lifestyle.  And absolutely no smoking or even second hand smoke. Most people know smoking can cause heart and lung diseases, but did you know it also contributes to weakening of your bones? Aging of your skin?  Yellowing of your teeth and nails?  CALCIUM AND VITAMIN D:  Adequate intake of calcium and Vitamin D are recommended.  The recommendations for exact amounts of these supplements seem to change often, but generally speaking 600 mg of calcium (either carbonate or citrate) and 800 units of Vitamin D per day seems prudent. Certain women may benefit from higher intake of Vitamin D.  If you are among these women, your doctor will have told you during your visit.    PAP SMEARS:  Pap smears, to check for cervical cancer or precancers,  have traditionally been done yearly, although recent scientific advances have shown that most women can have pap smears less often.  However, every woman still should have a physical exam from her gynecologist every year. It will include a breast check, inspection of the vulva and vagina to check for abnormal growths or skin changes, a visual exam of the cervix, and then an exam to evaluate the size and shape of the uterus and  ovaries.  And after 59 years of age, a rectal exam is indicated to check for rectal cancers. We will also provide age appropriate advice regarding health maintenance, like when you should have certain vaccines, screening for sexually transmitted diseases, bone density testing, colonoscopy, mammograms, etc.   MAMMOGRAMS:  All women over 40 years old should have a yearly mammogram. Many facilities now offer a "3D" mammogram, which may cost around $50 extra out of pocket. If possible,  we recommend you accept the option to have the 3D mammogram performed.  It both reduces the number of women who will be called back for extra views which then turn out to be normal, and it is better than the routine mammogram at detecting truly abnormal areas.    COLONOSCOPY:  Colonoscopy to screen for colon cancer is recommended for all women at age 50.  We know, you hate the idea of the prep.  We agree, BUT, having colon cancer and not knowing it is worse!!  Colon cancer so often starts as a polyp that can be seen and removed at colonscopy, which can quite literally save your life!  And if your first colonoscopy is normal and you have no family history of colon cancer, most women don't have to have it again for 10 years.  Once every ten years, you can do something that may end up saving your life, right?  We will be happy to help you get it scheduled when you are ready.    Be sure to check your insurance coverage so you understand how much it will cost.  It may be covered as a preventative service at no cost, but you should check your particular policy.    Calcium Content in Foods Calcium is the most abundant mineral in the body. Most of the body's calcium supply is stored in bones and teeth. Calcium helps many parts of the body function normally, including: Blood and blood vessels. Nerves. Hormones. Muscles. Bones and teeth. When your calcium stores are low, you may be at risk for low bone mass, bone loss, and broken bones  (fractures). When you get enough calcium, it helps to support strong bones and teeth throughout your life. Calcium is especially important for: Children during growth spurts. Girls during adolescence. Women who are pregnant or breastfeeding. Women after their menstrual cycle stops (postmenopause). Women whose menstrual cycle has stopped due to anorexia nervosa or regular intense exercise. People who cannot eat or digest dairy products. Vegans. Recommended daily amounts of calcium: Women (ages 19 to 50): 1,000 mg per day. Women (ages 51 and older): 1,200 mg per day. Men (ages 19 to 70): 1,000 mg per day. Men (ages 71 and older): 1,200 mg per day. Women (ages 9 to 18): 1,300 mg per day. Men (ages 9 to 18): 1,300 mg per day. General information Eat foods that are high in calcium. Try to get most of your calcium from food. Some people may benefit from taking calcium supplements. Check with your health care provider or diet and nutrition specialist (dietitian) before starting any calcium supplements. Calcium supplements may interact with certain medicines. Too much calcium may cause other health problems, such as constipation and kidney stones. For the body to absorb calcium, it needs vitamin D. Sources of vitamin D include: Skin exposure to direct sunlight. Foods, such as egg yolks, liver, mushrooms, saltwater fish, and fortified milk. Vitamin D supplements. Check with your health care provider or dietitian before starting any vitamin D supplements. What foods are high in calcium?  Foods that are high in calcium contain more than 100 milligrams per serving. Fruits Fortified orange juice or other fruit juice, 300 mg per 8 oz serving. Vegetables Collard greens, 360 mg per 8 oz serving. Kale, 100 mg per 8 oz serving. Bok choy, 160 mg per 8 oz serving. Grains Fortified ready-to-eat cereals, 100 to 1,000 mg per 8 oz serving. Fortified frozen waffles, 200 mg in 2 waffles. Oatmeal, 140 mg in  1 cup. Meats and other proteins Sardines, canned with bones, 325 mg per 3 oz serving. Salmon, canned with bones, 180 mg per 3 oz serving. Canned shrimp, 125 mg per 3 oz serving. Baked beans, 160 mg per 4 oz serving. Tofu, firm, made with calcium sulfate, 253 mg per 4 oz serving. Dairy Yogurt, plain, low-fat, 310 mg per 6 oz serving. Nonfat milk, 300 mg per 8 oz serving. American cheese, 195 mg per 1 oz serving. Cheddar cheese, 205 mg per 1 oz serving. Cottage cheese 2%, 105 mg per 4 oz serving. Fortified soy, rice, or almond milk, 300 mg per 8 oz serving. Mozzarella, part skim, 210 mg per 1 oz serving. The items listed above may not be a complete list of foods high in calcium. Actual amounts of calcium may be different depending on processing. Contact a dietitian for more information. What foods are lower in calcium? Foods that are lower in calcium contain 50 mg or less per serving. Fruits Apple, about 6 mg. Banana, about 12 mg.   Vegetables Lettuce, 19 mg per 2 oz serving. Tomato, about 11 mg. Grains Rice, 4 mg per 6 oz serving. Boiled potatoes, 14 mg per 8 oz serving. White bread, 6 mg per slice. Meats and other proteins Egg, 27 mg per 2 oz serving. Red meat, 7 mg per 4 oz serving. Chicken, 17 mg per 4 oz serving. Fish, cod, or trout, 20 mg per 4 oz serving. Dairy Cream cheese, regular, 14 mg per 1 Tbsp serving. Brie cheese, 50 mg per 1 oz serving. Parmesan cheese, 70 mg per 1 Tbsp serving. The items listed above may not be a complete list of foods lower in calcium. Actual amounts of calcium may be different depending on processing. Contact a dietitian for more information. Summary Calcium is an important mineral in the body because it affects many functions. Getting enough calcium helps support strong bones and teeth throughout your life. Try to get most of your calcium from food. Calcium supplements may interact with certain medicines. Check with your health care provider  or dietitian before starting any calcium supplements. This information is not intended to replace advice given to you by your health care provider. Make sure you discuss any questions you have with your health care provider. Document Revised: 09/24/2019 Document Reviewed: 09/24/2019 Elsevier Patient Education  2023 Elsevier Inc.  

## 2022-01-31 LAB — CYTOLOGY - PAP
Comment: NEGATIVE
Diagnosis: NEGATIVE
Diagnosis: REACTIVE
High risk HPV: NEGATIVE

## 2022-04-03 IMAGING — MG DIGITAL DIAGNOSTIC BILAT W/ TOMO W/ CAD
8 series · 8 of 24 positions shown · non-contrast
Comparison: Previous exam(s).

CLINICAL DATA: Patient presents for focal tenderness within the
right breast.

EXAM:
DIGITAL DIAGNOSTIC BILATERAL MAMMOGRAM WITH TOMOSYNTHESIS AND CAD;
ULTRASOUND RIGHT BREAST LIMITED
TECHNIQUE: Bilateral digital diagnostic mammography and breast tomosynthesis
was performed. The images were evaluated with computer-aided
detection.; Targeted ultrasound examination of the right breast was
performed

[R CC synth-2D]
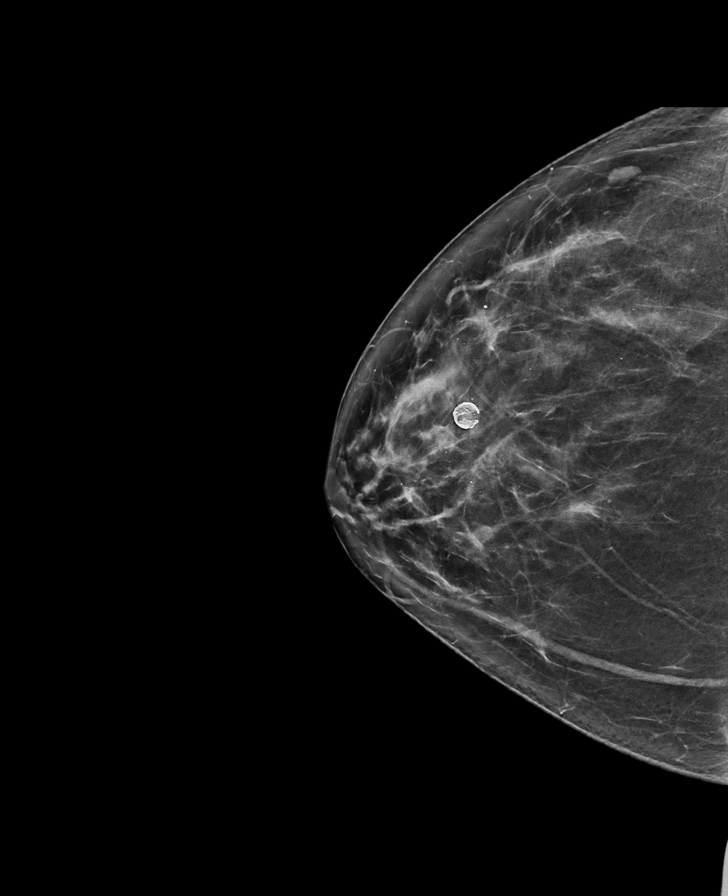

[L MLO synth-2D]
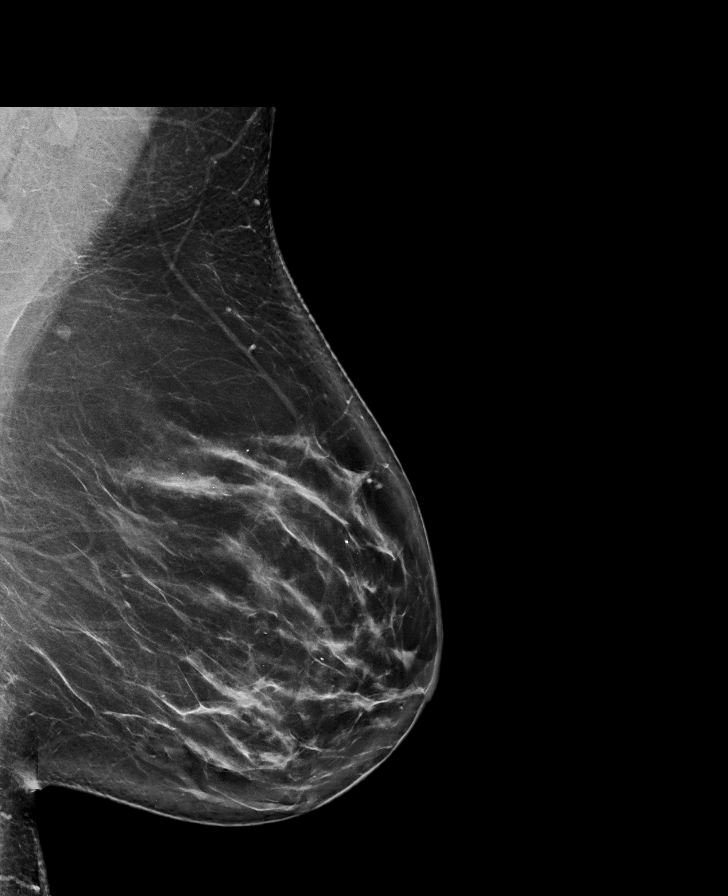

[R MLO synth-2D]
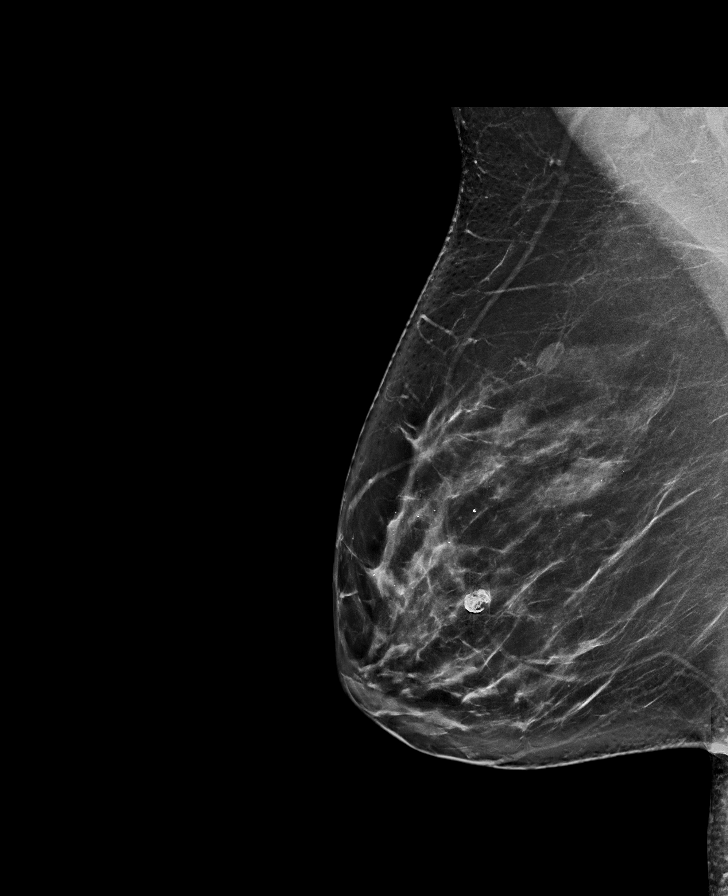

[L CC synth-2D]
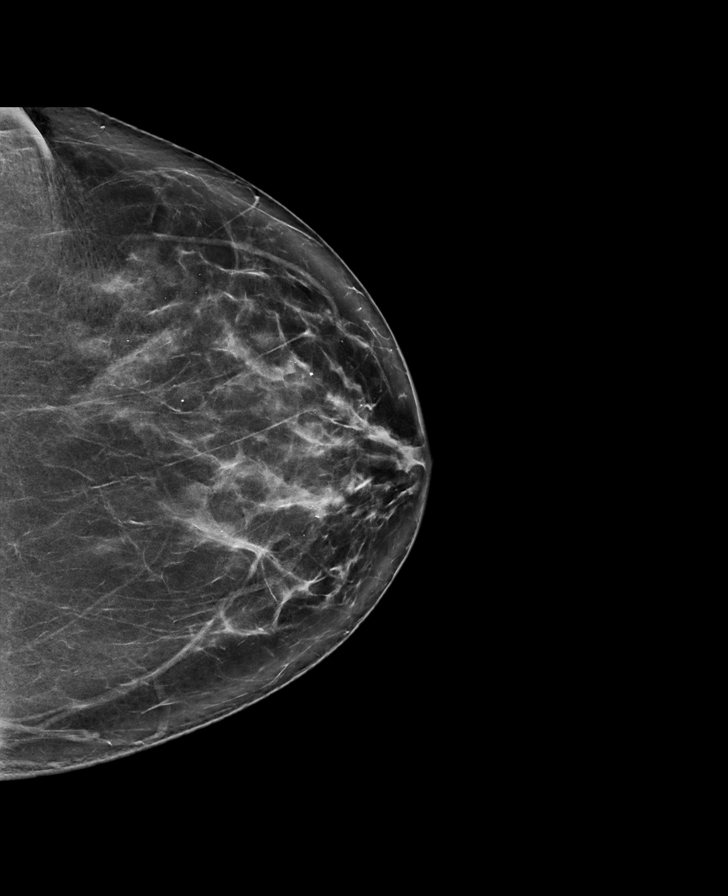

[L CC tomo · tomo slice 42/83.0]
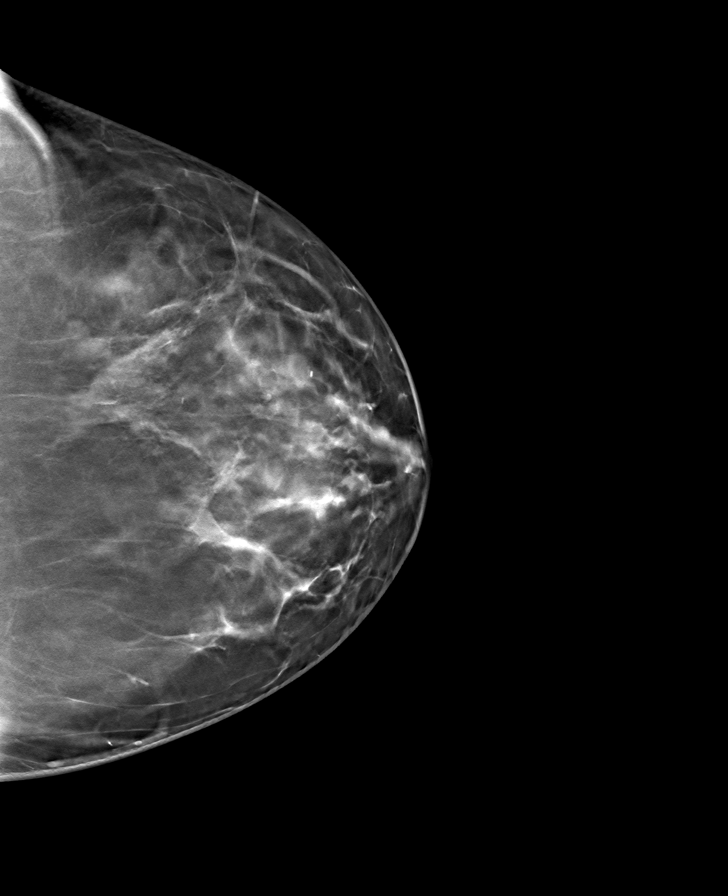

[R CC tomo · tomo slice 39/76.0]
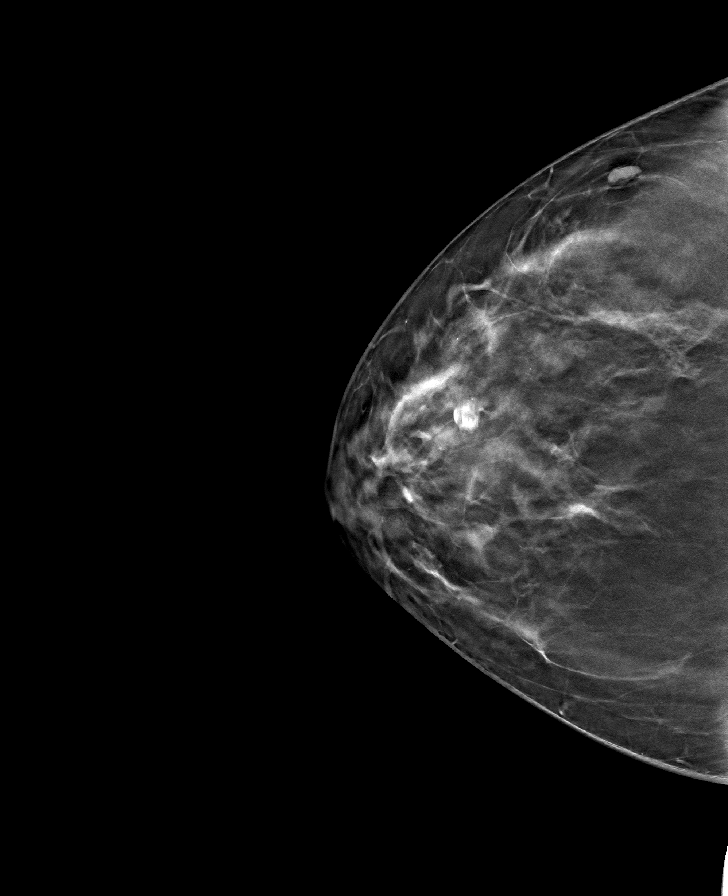

[R MLO tomo · tomo slice 46/91.0]
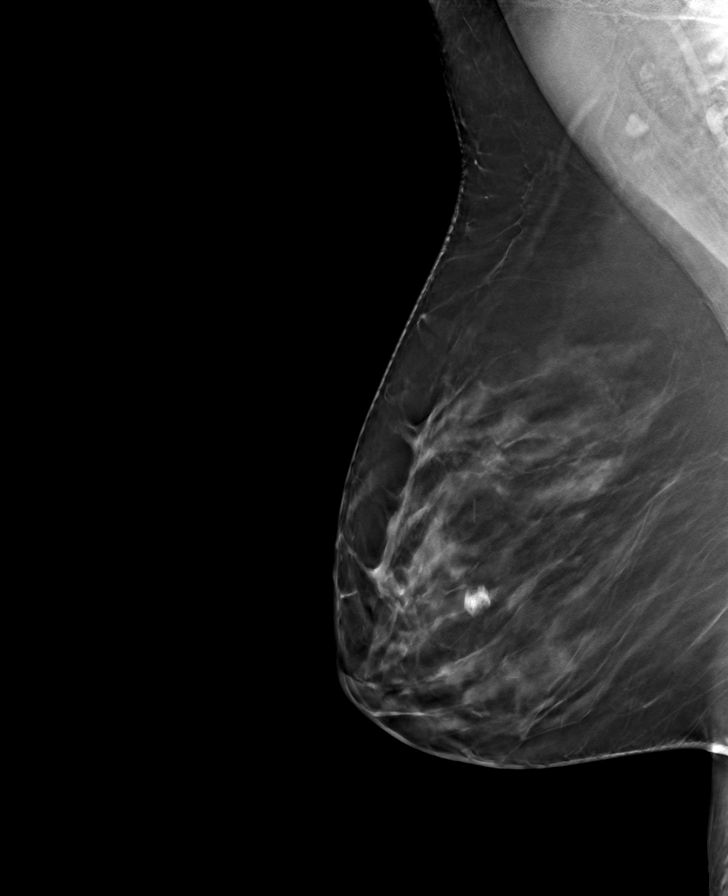

[L MLO tomo · tomo slice 47/94.0]
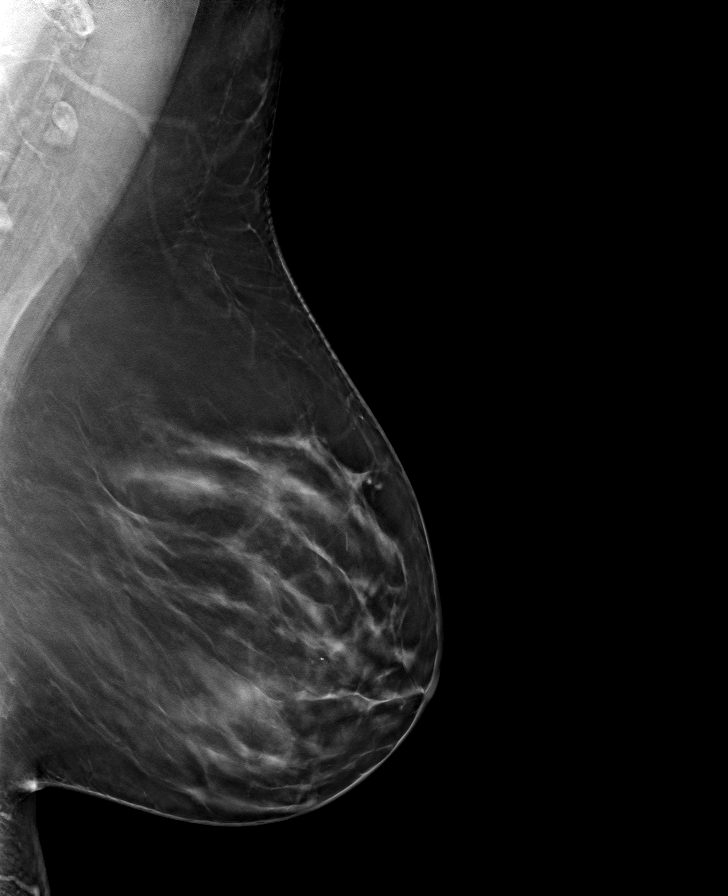

[8 of 24 positions shown; findings below may reference images not displayed]

ACR Breast Density Category c: The breast tissue is heterogeneously
dense, which may obscure small masses.
FINDINGS: No concerning masses, calcifications or nonsurgical distortion
identified within either breast.

On physical exam, small palpable mass within the lower outer right
chest wall.

Targeted ultrasound is performed, showing a 4 x 8 x 8 mm lobular
hypoechoic mass partially within the skin right breast 7 o'clock
position 10 cm from the nipple, favored to represent a ruptured
sebaceous cyst.
IMPRESSION: Focal area of tenderness within the lower outer right breast favored
to represent a ruptured sebaceous cyst.

No mammographic evidence for malignancy.

RECOMMENDATION:
Dermatologic consultation for removal of the suspected ruptured
sebaceous cyst.

Screening mammogram in one year.(Code:VW-7-N2Y)

I have discussed the findings and recommendations with the patient.
If applicable, a reminder letter will be sent to the patient
regarding the next appointment.

BI-RADS CATEGORY  2: Benign.

## 2022-07-04 ENCOUNTER — Other Ambulatory Visit: Payer: Self-pay | Admitting: Obstetrics and Gynecology

## 2022-07-04 DIAGNOSIS — Z1231 Encounter for screening mammogram for malignant neoplasm of breast: Secondary | ICD-10-CM

## 2022-08-18 ENCOUNTER — Ambulatory Visit: Payer: Self-pay | Admitting: Cardiology

## 2022-08-22 ENCOUNTER — Ambulatory Visit
Admission: RE | Admit: 2022-08-22 | Discharge: 2022-08-22 | Disposition: A | Discharge status: Home / Self Care | Payer: 59 | Source: Ambulatory Visit | Attending: Obstetrics and Gynecology | Admitting: Obstetrics and Gynecology

## 2022-08-22 DIAGNOSIS — Z1231 Encounter for screening mammogram for malignant neoplasm of breast: Secondary | ICD-10-CM

## 2022-09-18 NOTE — Progress Notes (Signed)
Cardiology Office Note:   Date:  09/25/2022  ID:  Kim Young, DOB 06-Aug-1960, MRN [id number]  History of Present Illness:   Kim BoatmanJackie C Young is a 62 y.o. female with history of anxiety, HLD and PACs who was referred by Dr. Ricci BarkerMarrow for further evaluation of bradycardia.  Of note, patient previously followed with Dr. Mayford Knifeurner with last visit in 2019. Had symptomatic PACs and was placed on bystolic. Ca score in 2016 0.  Patient seen by Dr. Kateri PlummerMorrow on 08/22/22. Notes reviewed. Has symptomatic PACs and was placed on BB but HR was running low and BP soft. Now referred to Cardiology for further evaluation.  Today, the patient states she previously followed with Dr. Mayford Knifeurner for symptomatic PACs and was placed on bystolic with significant improvement. She had followed up with her PCP and HR were on the lower side and her bystolic was reduced to 2.5mg  daily. On repeat evaluation in the PCP office, her blood pressure was low in the 80/60s prompting referral back to Cardiology. Notably, she has lost over 50lbs over the past several years.  Currently, she has been feeling more fatigued, low energy and lightheaded at home. Blood pressures at home were closely monitored and she has been running on the low end with systolics mainly 100 with occasional readings in the 90s. Otherwise, no chest pain, SOB, LE edema, orthopnea or PND. Remains active without exertional symptoms.   Past Medical History:  Diagnosis Date   Anxiety    Dry eye    Endometriosis    GERD (gastroesophageal reflux disease)    Hernia    D. Patterson   Hyperlipidemia    PAC (premature atrial contraction)      ROS: As per HPI  Studies Reviewed:    EKG:  Sinus bradycardia with HR 54bpm  Cardiac Studies & Procedures          CT SCANS  CT CARDIAC SCORING (SELF PAY ONLY) 06/25/2014  Addendum 06/25/2014  2:07 PM ADDENDUM REPORT: 06/25/2014 14:04  EXAM: OVER-READ INTERPRETATION  CT CHEST  The following report is an over-read  performed by radiologist Dr. Schuyler Amoraylor [facility] on 06/25/2014. This over-read does not include interpretation of cardiac or coronary anatomy or pathology. The Cardiac interpretation by the cardiologist is attached.  COMPARISON:  None.  FINDINGS: Calcified granuloma identified in the right upper lobe. No airspace consolidation. No suspicious nodule or mass noted.  Degenerative disc disease noted within the thoracic spine.  No mediastinal or hilar adenopathy noted. The visualized portions of the esophagus and lower airway appear unremarkable.  IMPRESSION: 1. No active cardiopulmonary abnormalities. 2. Prior granulomatous disease.   Electronically Signed By: Signa Kellaylor  Stroud M.D. On: 06/25/2014 14:04  Narrative CLINICAL DATA:  Risk stratification  EXAM: Coronary Calcium Score  MEDICATIONS: None  TECHNIQUE: The patient was scanned on a Siemens Sensation 16 slice scanner. Axial non-contrast 3 mm slices were carried out through the heart. The data set was analyzed on a dedicated work station and scored using the Agatson method.  FINDINGS: Non-cardiac: See [facility].  Ascending Aorta:  Normal caliber.  Pericardium: Normal  Coronary arteries:  Originating in normal position.  IMPRESSION: Coronary calcium score of 0. This was 0 percentile for age and sex matched control.  Tobias AlexanderKatarina Nelson  Electronically Signed: By: Tobias AlexanderKatarina  Nelson On: 06/25/2014 11:22           Risk Assessment/Calculations:              Physical  Exam:   VS:  BP 108/76   Pulse (!) 54   Ht 5\' 6"  (1.676 m)   Wt 162 lb 9.6 oz (73.8 kg)   LMP 02/25/2016 Comment: spotting only - on BC  SpO2 99%   BMI 26.24 kg/m    Wt Readings from Last 3 Encounters:  09/25/22 162 lb 9.6 oz (73.8 kg)  01/26/22 190 lb (86.2 kg)  10/30/17 209 lb (94.8 kg)     GEN: Well nourished, well developed in no acute distress NECK: No JVD; No carotid  bruits CARDIAC: Bradycardic, regular, no murmurs, rubs, gallops RESPIRATORY:  Clear to auscultation without rales, wheezing or rhonchi  ABDOMEN: Soft, non-tender, non-distended EXTREMITIES:  No edema; No deformity   ASSESSMENT AND PLAN:    #Hypotension: -Patient with episodes of low blood pressure while on bystolic in the setting of significant weight loss >50lbs over the past several years -Overall, suspect this is the primary driver of her dizziness/lightheadedness/fatigue -Will change off bystolic to metop 25mg  BID and monitor response (patient prefers to stay on nodal agent for symptomatic PACs) -Can reduce dose of metop to 12.5mg  BID if needed as well  #Symptomatic PACs: #Bradycardia: -Change from bystolic to metop 25mg  BID and monitor response -Patient prefers to stay on nodal agent due to significant symptoms of PACs in the past  #HLD: -Ca score 0 in 2016 -Will repeat Ca score for monitoring -Continue crestor 20mg  daily        Signed, Meriam SpragueHeather E Keigan Tafoya, MD

## 2022-09-25 ENCOUNTER — Encounter: Payer: Self-pay | Admitting: Cardiology

## 2022-09-25 ENCOUNTER — Ambulatory Visit: Payer: 59 | Attending: Cardiology | Admitting: Cardiology

## 2022-09-25 VITALS — BP 108/76 | HR 54 | Ht 66.0 in | Wt 162.6 lb

## 2022-09-25 DIAGNOSIS — Z79899 Other long term (current) drug therapy: Secondary | ICD-10-CM

## 2022-09-25 DIAGNOSIS — Z8249 Family history of ischemic heart disease and other diseases of the circulatory system: Secondary | ICD-10-CM

## 2022-09-25 DIAGNOSIS — I491 Atrial premature depolarization: Secondary | ICD-10-CM

## 2022-09-25 DIAGNOSIS — E7800 Pure hypercholesterolemia, unspecified: Secondary | ICD-10-CM

## 2022-09-25 DIAGNOSIS — I952 Hypotension due to drugs: Secondary | ICD-10-CM

## 2022-09-25 DIAGNOSIS — R001 Bradycardia, unspecified: Secondary | ICD-10-CM

## 2022-09-25 MED ORDER — METOPROLOL TARTRATE 25 MG PO TABS: 25.0000 mg | ORAL_TABLET | Freq: Two times a day (BID) | ORAL | 2 refills | Status: DC

## 2022-09-25 NOTE — Patient Instructions (Signed)
Medication Instructions:   STOP TAKING BYSTOLIC NOW  START TAKING METOPROLOL TARTRATE 25 MG BY MOUTH TWICE DAILY--YOU MAY CUT THIS IN 1/2 IF THIS DOSE IS TO MUCH FOR YOU  *If you need a refill on your cardiac medications before your next appointment, please call your pharmacy*    Testing/Procedures:  CARDIAC CALCIUM SCORE (SELF PAY)   Follow-Up: At [facility], you and your health needs are our priority.  As part of our continuing mission to provide you with exceptional heart care, we have created designated Provider Care Teams.  These Care Teams include your primary Cardiologist (physician) and Advanced Practice Providers (APPs -  Physician Assistants and Nurse Practitioners) who all work together to provide you with the care you need, when you need it.  We recommend signing up for the patient portal called "MyChart".  Sign up information is provided on this After Visit Summary.  MyChart is used to connect with patients for Virtual Visits (Telemedicine).  Patients are able to view lab/test results, encounter notes, upcoming appointments, etc.  Non-urgent messages can be sent to your provider as well.   To learn more about what you can do with MyChart, go to [email or web address].    Your next appointment:   6 month(s)  Provider:   DR. Shari Prows

## 2022-09-29 ENCOUNTER — Ambulatory Visit
Admission: RE | Admit: 2022-09-29 | Discharge: 2022-09-29 | Disposition: A | Discharge status: Home / Self Care | Payer: Self-pay | Source: Ambulatory Visit | Attending: Cardiology | Admitting: Cardiology

## 2022-09-29 DIAGNOSIS — E7800 Pure hypercholesterolemia, unspecified: Secondary | ICD-10-CM | POA: Insufficient documentation

## 2022-09-29 DIAGNOSIS — Z8249 Family history of ischemic heart disease and other diseases of the circulatory system: Secondary | ICD-10-CM | POA: Insufficient documentation

## 2022-09-29 DIAGNOSIS — Z79899 Other long term (current) drug therapy: Secondary | ICD-10-CM | POA: Insufficient documentation

## 2023-01-18 NOTE — Progress Notes (Signed)
62 y.o. G81P2002 Married Caucasian female here for annual exam.    Some vaginal dryness and external irritation.   Had change in her blood pressure medication.  She previously had an elevated heart rate and saw Dr. Mayford Knife from cardiology.  Tx with Metoprolol and Zoloft now.   PCP:  Farris Has, MD  Patient's last menstrual period was 02/25/2016.           Sexually active: Yes.    The current method of family planning is post menopausal status.    Exercising: no Smoker:  no  Health Maintenance: Pap:  01/26/22 neg: HR HPV neg, 03/15/17 neg: HR HPV neg History of abnormal Pap:  no MMG:  08/22/22 Breast Density Cat C, BI-RADS CAT 1 neg Colonoscopy:  2015 normal BMD:   n/a  Result  n/a TDaP:  PCP - Up to Date Gardasil:   no HIV: neg in past Hep C: 04/03/16 neg Screening Labs:  PCP   reports that she has never smoked. She has never used smokeless tobacco. She reports that she does not drink alcohol and does not use drugs.  Past Medical History:  Diagnosis Date   Anxiety    Dry eye    Endometriosis    GERD (gastroesophageal reflux disease)    Hernia    D. Patterson   Hyperlipidemia    PAC (premature atrial contraction)     Past Surgical History:  Procedure Laterality Date   CESAREAN SECTION  1998&2000   x2   FOOT SURGERY Left 05/2016   HEMORRHOID SURGERY     LAPAROSCOPY     endometriosis   TUBAL LIGATION      Current Outpatient Medications  Medication Sig Dispense Refill   metoprolol tartrate (LOPRESSOR) 25 MG tablet Take 1 tablet (25 mg total) by mouth 2 (two) times daily. 180 tablet 2   rosuvastatin (CRESTOR) 20 MG tablet Take 1 tablet (20 mg total) by mouth daily. Please make yearly appt with Dr. Mayford Knife for May before anymore refills. 1st attempt 90 tablet 0   sertraline (ZOLOFT) 100 MG tablet Take 100 mg by mouth daily.     triamcinolone (KENALOG) 0.025 % ointment Apply 1 Application topically 2 (two) times daily. Uses as needed. 30 g 1   No current  facility-administered medications for this visit.    Family History  Problem Relation Age of Onset   Heart disease Mother    Stroke Mother    Diabetes Mother    CVA Mother 93       on dialysis   Hypertension Mother    Hyperlipidemia Mother    Cancer Father        pancreatic   Cancer Sister        Cervical   Cancer Maternal Grandmother        Lung    Review of Systems  All other systems reviewed and are negative.   Exam:   BP 104/74 (BP Location: Left Arm, Patient Position: Sitting, Cuff Size: Normal)   Pulse (!) 55   Ht 5\' 7"  (1.702 m)   Wt 162 lb (73.5 kg)   LMP 02/25/2016 Comment: spotting only - on BC  SpO2 97%   BMI 25.37 kg/m     General appearance: alert, cooperative and appears stated age Head: normocephalic, without obvious abnormality, atraumatic Neck: no adenopathy, supple, symmetrical, trachea midline and thyroid normal to inspection and palpation Lungs: clear to auscultation bilaterally Breasts: normal appearance, no masses or tenderness, No nipple retraction or dimpling, No  nipple discharge or bleeding, No axillary adenopathy Heart: regular rate and rhythm Abdomen: soft, non-tender; no masses, no organomegaly Extremities: extremities normal, atraumatic, no cyanosis or edema Skin: skin color, texture, turgor normal. No rashes or lesions Lymph nodes: cervical, supraclavicular, and axillary nodes normal. Neurologic: grossly normal  Pelvic: External genitalia:  no lesions              No abnormal inguinal nodes palpated.              Urethra:  normal appearing urethra with no masses, tenderness or lesions              Bartholins and Skenes: normal                 Vagina: normal appearing vagina with normal color and discharge, no lesions              Cervix: no lesions              Pap taken: no Bimanual Exam:  Uterus:  normal size, contour, position, consistency, mobility, non-tender              Adnexa: no mass, fullness, tenderness              Rectal  exam: yes.  Confirms.              Anus:  normal sphincter tone, no lesions  Chaperone was present for exam:  Warren Lacy, CMA  Assessment:   Well woman visit with gynecologic exam. Vaginal atrophy.   Plan: Mammogram screening discussed. Self breast awareness reviewed. Pap and HR HPV as above. Guidelines for Calcium, Vitamin D, regular exercise program including cardiovascular and weight bearing exercise. Start vaginal estradiol cream. Instructed in use.  I discussed potential effect on breast cancer.  Vaccines discussed.  Follow up annually and prn.

## 2023-02-01 ENCOUNTER — Ambulatory Visit (INDEPENDENT_AMBULATORY_CARE_PROVIDER_SITE_OTHER): Payer: 59 | Admitting: Obstetrics and Gynecology

## 2023-02-01 ENCOUNTER — Encounter: Payer: Self-pay | Admitting: Obstetrics and Gynecology

## 2023-02-01 VITALS — BP 104/74 | HR 55 | Ht 67.0 in | Wt 162.0 lb

## 2023-02-01 DIAGNOSIS — Z01419 Encounter for gynecological examination (general) (routine) without abnormal findings: Secondary | ICD-10-CM

## 2023-02-01 MED ORDER — ESTRADIOL 0.1 MG/GM VA CREA: TOPICAL_CREAM | VAGINAL | 2 refills | Status: DC

## 2023-02-01 NOTE — Patient Instructions (Signed)

## 2023-06-29 ENCOUNTER — Other Ambulatory Visit: Payer: Self-pay

## 2023-06-29 DIAGNOSIS — Z8249 Family history of ischemic heart disease and other diseases of the circulatory system: Secondary | ICD-10-CM

## 2023-06-29 DIAGNOSIS — Z79899 Other long term (current) drug therapy: Secondary | ICD-10-CM

## 2023-06-29 DIAGNOSIS — E7800 Pure hypercholesterolemia, unspecified: Secondary | ICD-10-CM

## 2023-06-29 MED ORDER — METOPROLOL TARTRATE 25 MG PO TABS: 25.0000 mg | ORAL_TABLET | Freq: Two times a day (BID) | ORAL | 0 refills | Status: DC

## 2023-08-30 ENCOUNTER — Other Ambulatory Visit: Payer: Self-pay | Admitting: Obstetrics and Gynecology

## 2023-08-30 DIAGNOSIS — Z1231 Encounter for screening mammogram for malignant neoplasm of breast: Secondary | ICD-10-CM

## 2023-09-20 ENCOUNTER — Ambulatory Visit
Admission: RE | Admit: 2023-09-20 | Discharge: 2023-09-20 | Disposition: A | Discharge status: Home / Self Care | Source: Ambulatory Visit | Attending: Obstetrics and Gynecology | Admitting: Obstetrics and Gynecology

## 2023-09-20 ENCOUNTER — Other Ambulatory Visit: Payer: Self-pay | Admitting: Obstetrics and Gynecology

## 2023-09-20 DIAGNOSIS — N6310 Unspecified lump in the right breast, unspecified quadrant: Secondary | ICD-10-CM

## 2023-09-20 DIAGNOSIS — Z1231 Encounter for screening mammogram for malignant neoplasm of breast: Secondary | ICD-10-CM

## 2023-09-27 ENCOUNTER — Other Ambulatory Visit: Payer: Self-pay

## 2023-09-27 DIAGNOSIS — Z8249 Family history of ischemic heart disease and other diseases of the circulatory system: Secondary | ICD-10-CM

## 2023-09-27 DIAGNOSIS — Z79899 Other long term (current) drug therapy: Secondary | ICD-10-CM

## 2023-09-27 DIAGNOSIS — E7800 Pure hypercholesterolemia, unspecified: Secondary | ICD-10-CM

## 2023-09-27 MED ORDER — METOPROLOL TARTRATE 25 MG PO TABS: 25.0000 mg | ORAL_TABLET | Freq: Two times a day (BID) | ORAL | 0 refills | Status: AC

## 2023-10-04 ENCOUNTER — Ambulatory Visit
Admission: RE | Admit: 2023-10-04 | Discharge: 2023-10-04 | Disposition: A | Discharge status: Home / Self Care | Source: Ambulatory Visit | Attending: Obstetrics and Gynecology | Admitting: Obstetrics and Gynecology

## 2023-10-04 DIAGNOSIS — N6310 Unspecified lump in the right breast, unspecified quadrant: Secondary | ICD-10-CM

## 2023-10-09 ENCOUNTER — Encounter: Payer: Self-pay | Admitting: Obstetrics and Gynecology

## 2023-12-07 ENCOUNTER — Telehealth: Payer: Self-pay

## 2023-12-07 NOTE — Telephone Encounter (Signed)
 [facility] is who we recommend [phone]

## 2023-12-07 NOTE — Telephone Encounter (Signed)
 Patient notified of information & ER precautions given.

## 2023-12-07 NOTE — Telephone Encounter (Signed)
 Patient called & left voicemail on triage line. She states that she occasional gets sebaceous cyst under her rt breast by her bra line. She states Dr. Nikki told her that if she continues to get them that she would give her the information for her to call a general surgeon to see them. She states the one she has now is hot, red, swollen & painful. I attempted to call patient but it went to voicemail. Please advise. Routing to Dr Nikki ODESSIA Cozier, NP

## 2024-01-01 HISTORY — PX: OTHER SURGICAL HISTORY: SHX169

## 2024-02-29 ENCOUNTER — Ambulatory Visit: Admitting: Internal Medicine

## 2024-02-29 ENCOUNTER — Encounter: Payer: Self-pay | Admitting: Internal Medicine

## 2024-02-29 ENCOUNTER — Other Ambulatory Visit: Payer: Self-pay

## 2024-02-29 VITALS — BP 96/70 | HR 55 | Temp 97.5°F | Ht 67.25 in | Wt 178.0 lb

## 2024-02-29 DIAGNOSIS — Z711 Person with feared health complaint in whom no diagnosis is made: Secondary | ICD-10-CM

## 2024-02-29 DIAGNOSIS — H6993 Unspecified Eustachian tube disorder, bilateral: Secondary | ICD-10-CM

## 2024-02-29 DIAGNOSIS — J3089 Other allergic rhinitis: Secondary | ICD-10-CM

## 2024-02-29 MED ORDER — RYALTRIS 665-25 MCG/ACT NA SUSP: 1.0000 | Freq: Two times a day (BID) | NASAL | 5 refills | Status: AC

## 2024-02-29 NOTE — Progress Notes (Signed)
 NEW PATIENT Date of Service/Encounter:  02/29/24 Referring provider: Kip Righter, MD Primary care provider: Kip Righter, MD  Subjective:  Kim Young is a 63 y.o. female  presenting today for evaluation of ear symptoms, environmental allergies  History obtained from: chart review and patient.   Discussed the use of AI scribe software for clinical note transcription with the patient, who gave verbal consent to proceed.  History of Present Illness Kim Young is a 63 year old female who presents with chronic itchy ears and allergy symptoms.  Otologic pruritus - Chronic itchy ears for years, with increased frequency and duration recently - Symptoms now persist for weeks without a clear seasonal pattern - Minimal ear drainage - No ear fullness, muffled hearing, or frequent ear infections - some post nasal drip   Allergic symptoms - No significant nasal congestion or sneezing except with dust exposure - No use of nasal sprays due to absence of nasal stuffiness - No frequent sinus infections - No sinus pressure or headaches except when moving between regions (e.g., [place]  and [place] )  Response to allergy medications - Tried multiple over-the-counter antihistamines, including fexofenadine and cetirizine - Initial relief with antihistamines, but efficacy diminished over time - Alternated between non-drowsy antihistamines; cetirizine did not cause drowsiness  Allergy history - Allergy testing approximately 30 years ago indicated multiple allergies, including shellfish - Currently consumes shellfish without adverse reactions - no longer has epipen  - History of being allergic to many substances  Gastrointestinal symptoms - No reflux or heartburn    Other allergy screening: Asthma: no Rhino conjunctivitis: yes Food allergy: history of positive shrimp testing  Medication allergy: yes Hymenoptera allergy: no Urticaria: no Eczema:no History of  recurrent infections suggestive of immunodeficency: no Vaccinations are up to date.   Past Medical History: Past Medical History:  Diagnosis Date   Anxiety    Dry eye    Endometriosis    GERD (gastroesophageal reflux disease)    Hernia    D. Patterson   Hyperlipidemia    PAC (premature atrial contraction)    Medication List:  Current Outpatient Medications  Medication Sig Dispense Refill   cyanocobalamin (VITAMIN B12) 1000 MCG tablet Take 1,000 mcg by mouth daily.     estradiol  (ESTRACE ) 0.1 MG/GM vaginal cream Use 1/2 g vaginally every night for the first 2 weeks, then use 1/2 g vaginally two or three times per week as needed to maintain symptom relief. 42.5 g 2   metoprolol  tartrate (LOPRESSOR ) 25 MG tablet Take 1 tablet (25 mg total) by mouth 2 (two) times daily. 30 tablet 0   Multiple Vitamin (MULTIVITAMIN) tablet Take 1 tablet by mouth daily.     Olopatadine-Mometasone (RYALTRIS ) 665-25 MCG/ACT SUSP Place 1 spray into the nose 2 (two) times daily. 29 g 5   rosuvastatin  (CRESTOR ) 20 MG tablet Take 1 tablet (20 mg total) by mouth daily. Please make yearly appt with Dr. Shlomo for May before anymore refills. 1st attempt 90 tablet 0   sertraline (ZOLOFT) 100 MG tablet Take 100 mg by mouth daily.     triamcinolone  (KENALOG ) 0.025 % ointment Apply 1 Application topically 2 (two) times daily. Uses as needed. (Patient not taking: Reported on 02/29/2024) 30 g 1   No current facility-administered medications for this visit.   Known Allergies:  Allergies  Allergen Reactions   Cymbalta [Duloxetine Hcl]     agitation   Penicillin G Other (See Comments)   Penicillins     unknown  Past Surgical History: Past Surgical History:  Procedure Laterality Date   CESAREAN SECTION  1998&2000   x2   FOOT SURGERY Left 05/2016   HEMORRHOID SURGERY     LAPAROSCOPY     endometriosis   TUBAL LIGATION     Family History: Family History  Problem Relation Age of Onset   Heart disease Mother     Stroke Mother    Diabetes Mother    CVA Mother 46       on dialysis   Hypertension Mother    Hyperlipidemia Mother    Cancer Father        pancreatic   Cancer Sister        Cervical   Cancer Maternal Grandmother        Lung   Social History: [place] lives single-family home this 63 years old.  No water damage in the house.  Hardwood throughout.  No animals.  No roaches.  Bed is not to get off floor.  No dust mite precautions.  Not working..   ROS:  All other systems negative except as noted per HPI.  Objective:  Blood pressure 96/70, pulse (!) 55, temperature (!) 97.5 F (36.4 C), temperature source Temporal, height 5' 7.25 (1.708 m), weight 178 lb (80.7 kg), last menstrual period 02/25/2016, SpO2 97%. Body mass index is 27.67 kg/m. Physical Exam:  General Appearance:  Alert, cooperative, no distress, appears stated age  Head:  Normocephalic, without obvious abnormality, atraumatic  Eyes:  Conjunctiva clear, EOM's intact  Ears Left tm- mild serous effusions R- tm normal  and EACs normal bilaterally  Nose: Nares normal, normal mucosa, no visible anterior polyps, and septum midline  Throat: Lips, tongue normal; teeth and gums normal, normal posterior oropharynx  Neck: Supple, symmetrical  Lungs:   clear to auscultation bilaterally, Respirations unlabored, no coughing  Heart:  regular rate and rhythm and no murmur, Appears well perfused  Extremities: No edema  Skin: Skin color, texture, turgor normal and no rashes or lesions on visualized portions of skin  Neurologic: No gross deficits   Diagnostics: None done    Labs:  Lab Orders  No laboratory test(s) ordered today     Assessment and Plan  Assessment and Plan Assessment & Plan Chronic upper airway allergic symptoms with ear itching Chronic allergic rhinitis with possible postnasal drip affecting ears. Previous antihistamines ineffective. ENT evaluation unremarkable. - Prescribed ryaltris  a nasal spray, one spray  per nostril twice daily.  -samples provided, sent to mail order pharmacy   -aim upward and outward  - Scheduled allergy testing; advised discontinuation of antihistamines three days prior.   History of positive shellfish testing - Patient tolerates shellfish ingestion,  - Likely previous false positive skin testing - No indication for EpiPen or further evaluation  Follow-up for allergy test (1 through 55) avoid all antihistamines for 3 days prior  This note in its entirety was forwarded to the Provider who requested this consultation.  Other: samples provided of: ryaltris    Thank you for your kind referral. I appreciate the opportunity to take part in Dealie's care. Please do not hesitate to contact me with questions.  Sincerely,  Thank you so much for letting me partake in your care today.  Don't hesitate to reach out if you have any additional concerns!  Hargis Springer, MD  [facility] [place] [place]

## 2024-02-29 NOTE — Patient Instructions (Addendum)
 Chronic upper airway allergic symptoms with ear itching Chronic allergic rhinitis with possible postnasal drip affecting ears. Previous antihistamines ineffective. ENT evaluation unremarkable. - Prescribed ryaltris  a nasal spray, one spray per nostril twice daily.  -samples provided, sent to mail order pharmacy   -aim upward and outward  - Scheduled allergy testing; advised discontinuation of antihistamines three days prior.   History of positive shellfish testing - Patient tolerates shellfish ingestion,  - Likely previous false positive skin testing, counseled patient on risks of false positives with allergy food testing  - No indication for EpiPen or further evaluation  Follow-up for allergy test (1 through 55) avoid all antihistamines for 3 days prior  Thank you so much for letting me partake in your care today.  Don't hesitate to reach out if you have any additional concerns!  Hargis Springer, MD  [facility] [place], [place]

## 2024-03-14 ENCOUNTER — Ambulatory Visit: Admitting: Internal Medicine

## 2024-03-14 DIAGNOSIS — J3089 Other allergic rhinitis: Secondary | ICD-10-CM | POA: Diagnosis not present

## 2024-03-14 DIAGNOSIS — J302 Other seasonal allergic rhinitis: Secondary | ICD-10-CM | POA: Diagnosis not present

## 2024-03-14 NOTE — Progress Notes (Signed)
 Date of Service/Encounter:  03/14/24  Allergy testing appointment   Initial visit on 02/29/24, seen for rhinitis .  Please see that note for additional details.  Today reports for allergy diagnostic testing:    DIAGNOSTICS:  Skin Testing: Environmental allergy panel. Adequate positive and negative controls Results discussed with patient/family.  Airborne Adult Perc - 03/14/24 0900     Time Antigen Placed 9091    Allergen Manufacturer Jestine    Location Back    Number of Test 55    Panel 1 Select    1. Control-Buffer 50% Glycerol Negative    2. Control-Histamine 2+    3. Bahia Negative    4. French Southern Territories Negative    5. Johnson Negative    6. [place]  Blue Negative    7. Meadow Fescue Negative    8. Perennial Rye Negative    9. Timothy Negative    10. Ragweed Mix Negative    11. Cocklebur Negative    12. Plantain,  English Negative    13. Baccharis Negative    14. Dog Fennel Negative    15. Russian Thistle Negative    16. Lamb's Quarters Negative    17. Sheep Sorrell Negative    18. Rough Pigweed Negative    19. Marsh Elder, Rough Negative    20. Mugwort, Common Negative    21. Box, Elder Negative    22. Cedar, red Negative    23. Sweet Gum Negative    24. Pecan Pollen Negative    25. Pine Mix Negative    26. Walnut, Black Pollen Negative    27. Red Mulberry Negative    28. Ash Mix Negative    29. Birch Mix Negative    30. Beech American Negative    31. Cottonwood, Guinea-Bissau Negative    32. Hickory, White Negative    33. Maple Mix Negative    34. Oak, Guinea[place] Mix Negative    35. Sycamore Eastern Negative    36. Alternaria Alternata Negative    37. Cladosporium Herbarum Negative    38. Aspergillus Mix Negative    39. Penicillium Mix Negative    40. Bipolaris Sorokiniana (Helminthosporium) Negative    41. Drechslera Spicifera (Curvularia) Negative    42. Mucor Plumbeus Negative    43. Fusarium Moniliforme Negative    44. Aureobasidium Pullulans (pullulara)  Negative    45. Rhizopus Oryzae Negative    46. Botrytis Cinera Negative    47. Epicoccum Nigrum Negative    48. Phoma Betae Negative    49. Dust Mite Mix Negative    50. Cat Hair 10,000 BAU/ml Negative    51.  Dog Epithelia Negative    52. Mixed Feathers Negative    53. Horse Epithelia Negative    54. Cockroach, German Negative    55. Tobacco Leaf Negative          Intradermal - 03/14/24 1003     Time Antigen Placed 9040    Allergen Manufacturer Jestine    Location Arm    Number of Test 16    Control Negative    Bahia 2+    French Southern Territ[place] 2+    Johnson 2+    7 Grass Negative    Ragweed Mix 2+    Weed Mix 2+    Tree Mix Negative    Mold 1 2+    Mold 2 2+    Mold 3 Negative    Mold 4 3+    Mite Mix 2+    Cat  Negative    Dog Negative    Cockroach Negative          Allergy testing results were read and interpreted by myself, documented by clinical staff.  Patient provided with copy of allergy testing along with avoidance measures when indicated.   Hargis Springer, MD  [facility] [place] 

## 2024-03-14 NOTE — Patient Instructions (Addendum)
 Allergy test (03/14/24); grass, weed, mold, dust mite,  Start avoidance measures   Chronic upper airway allergic symptoms with ear itching Chronic allergic rhinitis with possible postnasal drip affecting ears. Previous antihistamines ineffective. ENT evaluation unremarkable. - Continue ryaltris  a nasal spray, one spray per nostril twice daily. - Start daily antihistamine: zyrtec, claritin, allegra or xyzal  Follow-up: 6 months   Thank you so much for letting me partake in your care today.  Don't hesitate to reach out if you have any additional concerns!  Hargis Springer, MD  [facility] [place], [place]  Reducing Pollen Exposure  The American Academy of Allergy, Asthma and Immunology suggests the following steps to reduce your exposure to pollen during allergy seasons.    Do not hang sheets or clothing out to dry; pollen may collect on these items. Do not mow lawns or spend time around freshly cut grass; mowing stirs up pollen. Keep windows closed at night.  Keep car windows closed while driving. Minimize morning activities outdoors, a time when pollen counts are usually at their highest. Stay indoors as much as possible when pollen counts or humidity is high and on windy days when pollen tends to remain in the air longer. Use air conditioning when possible.  Many air conditioners have filters that trap the pollen spores. Use a HEPA room air filter to remove pollen form the indoor air you breathe.  Control of Mold Allergen   Mold and fungi can grow on a variety of surfaces provided certain temperature and moisture conditions exist.  Outdoor molds grow on plants, decaying vegetation and soil.  The major outdoor mold, Alternaria and Cladosporium, are found in very high numbers during hot and dry conditions.  Generally, a late Summer - Fall peak is seen for common outdoor fungal spores.  Rain will temporarily lower outdoor mold spore count, but counts rise rapidly when the  rainy period ends.  The most important indoor molds are Aspergillus and Penicillium.  Dark, humid and poorly ventilated basements are ideal sites for mold growth.  The next most common sites of mold growth are the bathroom and the kitchen.  Outdoor (Seasonal) Mold Control  Use air conditioning and keep windows closed Avoid exposure to decaying vegetation. Avoid leaf raking. Avoid grain handling. Consider wearing a face mask if working in moldy areas.    Indoor (Perennial) Mold Control   Maintain humidity below 50%. Clean washable surfaces with 5% bleach solution. Remove sources e.g. contaminated carpets.    DUST MITE AVOIDANCE MEASURES:  There are three main measures that need and can be taken to avoid house dust mites:  Reduce accumulation of dust in general -reduce furniture, clothing, carpeting, books, stuffed animals, especially in bedroom  Separate yourself from the dust -use pillow and mattress encasements (can be found at stores such as [employer] or online) -avoid direct exposure to air condition flow -use a HEPA filter device, especially in the bedroom; you can also use a HEPA filter vacuum cleaner -wipe dust with a moist towel instead of a dry towel or broom when cleaning  Decrease mites and/or their secretions -wash clothing and linen and stuffed animals at highest temperature possible, at least every 2 weeks -stuffed animals can also be placed in a bag and put in a freezer overnight  Despite the above measures, it is impossible to eliminate dust mites or their allergen completely from your home.  With the above measures the burden of mites in your home can  be diminished, with the goal of minimizing your allergic symptoms.  Success will be reached only when implementing and using all means together.

## 2024-04-18 ENCOUNTER — Ambulatory Visit: Payer: Self-pay | Admitting: Obstetrics and Gynecology

## 2024-04-18 LAB — HM COLONOSCOPY

## 2024-05-13 ENCOUNTER — Encounter: Payer: Self-pay | Admitting: Obstetrics and Gynecology

## 2024-05-13 ENCOUNTER — Ambulatory Visit (INDEPENDENT_AMBULATORY_CARE_PROVIDER_SITE_OTHER): Admitting: Obstetrics and Gynecology

## 2024-05-13 VITALS — BP 110/72 | HR 61 | Ht 67.25 in | Wt 181.0 lb

## 2024-05-13 DIAGNOSIS — Z1331 Encounter for screening for depression: Secondary | ICD-10-CM | POA: Diagnosis not present

## 2024-05-13 DIAGNOSIS — Z01419 Encounter for gynecological examination (general) (routine) without abnormal findings: Secondary | ICD-10-CM | POA: Diagnosis not present

## 2024-05-13 MED ORDER — ESTRADIOL 0.01 % VA CREA: TOPICAL_CREAM | VAGINAL | 3 refills | Status: AC

## 2024-05-13 NOTE — Patient Instructions (Signed)

## 2024-05-13 NOTE — Progress Notes (Signed)
 63 y.o. G37P2002 Married Caucasian female here for annual exam.    Had a right breast mass removed 01/01/24 at [facility].   It was a benign  sebaceous cyst.    Would like to continue vaginal estrogen cream. Has some vaginal dryness.     Retired and then returned to work to help with a project.   PCP: Kip Righter, MD   Patient's last menstrual period was 02/25/2016.           Sexually active: Yes.    The current method of family planning is post menopausal status.    Menopausal hormone therapy:  Estrace   Exercising: No.   Smoker:  no  OB History  Gravida Para Term Preterm AB Living  2 2 2   2   SAB IAB Ectopic Multiple Live Births      2    # Outcome Date GA Lbr Len/2nd Weight Sex Type Anes PTL Lv  2 Term 2000 [redacted]w[redacted]d  7 lb 13 oz (3.544 kg) M CS-Unspec   LIV  1 Term 1998 [redacted]w[redacted]d  7 lb 13 oz (3.544 kg) F CS-Unspec   LIV     HEALTH MAINTENANCE: Last 2 paps:  01/26/22 neg HR HPV neg, 03/15/17 neg HPV neg  History of abnormal Pap or positive HPV:  no Mammogram:   10/04/23 Breast Density Cat C, BIRADS Cat 2 benign  Colonoscopy:  04/18/24 - polyp - due in 2032 Bone Density:  n/a  Result  n/a    There is no immunization history on file for this patient.    reports that she has never smoked. She has never used smokeless tobacco. She reports that she does not drink alcohol and does not use drugs.  Past Medical History:  Diagnosis Date   Anxiety    Dry eye    Endometriosis    GERD (gastroesophageal reflux disease)    Hernia    D. Patterson   Hyperlipidemia    PAC (premature atrial contraction)     Past Surgical History:  Procedure Laterality Date   breast surgery Right 01/01/2024   Benign sebaceous cyst   CESAREAN SECTION  1998&2000   x2   FOOT SURGERY Left 05/2016   HEMORRHOID SURGERY     LAPAROSCOPY     endometriosis   TUBAL LIGATION      Current Outpatient Medications  Medication Sig Dispense Refill   cyanocobalamin (VITAMIN B12) 1000 MCG tablet Take 1,000 mcg by  mouth daily.     estradiol  (ESTRACE ) 0.1 MG/GM vaginal cream Use 1/2 g vaginally every night for the first 2 weeks, then use 1/2 g vaginally two or three times per week as needed to maintain symptom relief. 42.5 g 2   metoprolol  tartrate (LOPRESSOR ) 25 MG tablet Take 1 tablet (25 mg total) by mouth 2 (two) times daily. 30 tablet 0   Multiple Vitamin (MULTIVITAMIN) tablet Take 1 tablet by mouth daily.     Olopatadine-Mometasone (RYALTRIS ) 665-25 MCG/ACT SUSP Place 1 spray into the nose 2 (two) times daily. 29 g 5   rosuvastatin  (CRESTOR ) 20 MG tablet Take 1 tablet (20 mg total) by mouth daily. Please make yearly appt with Dr. Shlomo for May before anymore refills. 1st attempt 90 tablet 0   sertraline (ZOLOFT) 100 MG tablet Take 100 mg by mouth daily.     No current facility-administered medications for this visit.    ALLERGIES: Cymbalta [duloxetine hcl], Penicillin g, and Penicillins  Family History  Problem Relation Age of Onset  Heart disease Mother    Stroke Mother    Diabetes Mother    CVA Mother 84       on dialysis   Hypertension Mother    Hyperlipidemia Mother    Cancer Father        pancreatic   Cancer Sister        Cervical   Cancer Maternal Grandmother        Lung    Review of Systems  All other systems reviewed and are negative.   PHYSICAL EXAM:  BP 110/72 (BP Location: Left Arm, Patient Position: Sitting)   Pulse 61   Ht 5' 7.25 (1.708 m)   Wt 181 lb (82.1 kg)   LMP 02/25/2016 Comment: spotting only - on BC  SpO2 98%   BMI 28.14 kg/m     General appearance: alert, cooperative and appears stated age Head: normocephalic, without obvious abnormality, atraumatic Neck: no adenopathy, supple, symmetrical, trachea midline and thyroid normal to inspection and palpation Lungs: clear to auscultation bilaterally Breasts: right - scar under right breast, no masses or tenderness, No nipple retraction or dimpling, No nipple discharge or bleeding, No axillary  adenopathy Left - normal appearance, no masses or tenderness, No nipple retraction or dimpling, No nipple discharge or bleeding, No axillary adenopathy Heart: regular rate and rhythm Abdomen: soft, non-tender; no masses, no organomegaly Extremities: extremities normal, atraumatic, no cyanosis or edema Skin: skin color, texture, turgor normal. No rashes or lesions Lymph nodes: cervical, supraclavicular, and axillary nodes normal. Neurologic: grossly normal  Pelvic: External genitalia:  no lesions              No abnormal inguinal nodes palpated.              Urethra:  normal appearing urethra with no masses, tenderness or lesions              Bartholins and Skenes: normal                 Vagina: normal appearing vagina with normal color and discharge, no lesions              Cervix: no lesions              Pap taken: no Bimanual Exam:  Uterus:  normal size, contour, position, consistency, mobility, non-tender              Adnexa: no mass, fullness, tenderness              Rectal exam: yes.  Confirms.              Anus:  normal sphincter tone, no lesions  Chaperone was present for exam:  Kari HERO, CMA  ASSESSMENT: Well woman visit with gynecologic exam. Vaginal atrophy. Encounter for medication monitoring.  Status post excision of right breast sebaceous cyst. PHQ-2-9: 0  PLAN: Mammogram screening discussed. Self breast awareness reviewed. Pap and HRV collected:  no Guidelines for Calcium , Vitamin D , regular exercise program including cardiovascular and weight bearing exercise. Medication refills:  Estrace  cream 1 gram pv twice weekly.  Disp:  42.5 gram, RF 3.   I discussed potential effect on a breast cancer. Labs with PCP.  Follow up:  yearly and prn.

## 2024-07-16 ENCOUNTER — Other Ambulatory Visit: Payer: Self-pay | Admitting: Family Medicine

## 2024-07-16 DIAGNOSIS — Z1231 Encounter for screening mammogram for malignant neoplasm of breast: Secondary | ICD-10-CM

## 2024-09-26 ENCOUNTER — Ambulatory Visit: Admitting: Internal Medicine

## 2024-10-06 ENCOUNTER — Ambulatory Visit

## 2025-06-08 ENCOUNTER — Ambulatory Visit: Admitting: Obstetrics and Gynecology
